# Patient Record
Sex: Male | Born: 1960 | Race: White | Hispanic: Yes | Marital: Married | Smoking: Former smoker
Health system: Southern US, Community
[De-identification: ages and names within clinical notes are randomized; demographics above are authoritative.]

## PROBLEM LIST (undated history)

## (undated) DIAGNOSIS — K648 Other hemorrhoids: Secondary | ICD-10-CM

## (undated) DIAGNOSIS — M25519 Pain in unspecified shoulder: Secondary | ICD-10-CM

## (undated) DIAGNOSIS — R229 Localized swelling, mass and lump, unspecified: Secondary | ICD-10-CM

## (undated) DIAGNOSIS — R55 Syncope and collapse: Secondary | ICD-10-CM

## (undated) DIAGNOSIS — Z86010 Personal history of colonic polyps: Secondary | ICD-10-CM

## (undated) DIAGNOSIS — J209 Acute bronchitis, unspecified: Secondary | ICD-10-CM

## (undated) DIAGNOSIS — L255 Unspecified contact dermatitis due to plants, except food: Secondary | ICD-10-CM

## (undated) HISTORY — DX: Unspecified contact dermatitis due to plants, except food: L25.5

## (undated) HISTORY — PX: COLONOSCOPY: SHX174

## (undated) HISTORY — DX: Other hemorrhoids: K64.8

## (undated) HISTORY — DX: Personal history of colonic polyps: Z86.010

## (undated) HISTORY — DX: Acute bronchitis, unspecified: J20.9

## (undated) HISTORY — DX: Pain in unspecified shoulder: M25.519

## (undated) HISTORY — DX: Syncope and collapse: R55

## (undated) HISTORY — PX: UMBILICAL HERNIA REPAIR: SHX196

## (undated) HISTORY — DX: Localized swelling, mass and lump, unspecified: R22.9

---
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
## 2005-10-08 ENCOUNTER — Encounter: Admission: RE | Admit: 2005-10-08 | Discharge: 2005-10-08 | Payer: Self-pay | Admitting: Occupational Medicine

## 2009-11-25 ENCOUNTER — Telehealth (INDEPENDENT_AMBULATORY_CARE_PROVIDER_SITE_OTHER): Payer: Self-pay | Admitting: *Deleted

## 2009-11-25 ENCOUNTER — Ambulatory Visit: Payer: Self-pay | Admitting: Internal Medicine

## 2009-12-14 ENCOUNTER — Ambulatory Visit: Payer: Self-pay | Admitting: Internal Medicine

## 2009-12-20 LAB — CONVERTED CEMR LAB
ALT: 39 units/L (ref 0–53)
AST: 27 units/L (ref 0–37)
Alkaline Phosphatase: 85 units/L (ref 39–117)
BUN: 12 mg/dL (ref 6–23)
Basophils Relative: 0.6 % (ref 0.0–3.0)
Bilirubin, Direct: 0.1 mg/dL (ref 0.0–0.3)
Calcium: 9.2 mg/dL (ref 8.4–10.5)
Chloride: 102 meq/L (ref 96–112)
Cholesterol: 216 mg/dL — ABNORMAL HIGH (ref 0–200)
Creatinine, Ser: 0.8 mg/dL (ref 0.4–1.5)
Direct LDL: 136.6 mg/dL
Eosinophils Relative: 2.6 % (ref 0.0–5.0)
GFR calc non Af Amer: 108.27 mL/min (ref >60)
Lymphocytes Relative: 33.2 % (ref 12.0–46.0)
Monocytes Absolute: 1.2 10*3/uL — ABNORMAL HIGH (ref 0.1–1.0)
Monocytes Relative: 9.8 % (ref 3.0–12.0)
Neutrophils Relative %: 53.8 % (ref 43.0–77.0)
Platelets: 236 10*3/uL (ref 150.0–400.0)
RBC: 5.32 M/uL (ref 4.22–5.81)
TSH: 1.01 microintl units/mL (ref 0.35–5.50)
Total Bilirubin: 1 mg/dL (ref 0.3–1.2)
Total CHOL/HDL Ratio: 6
Total Protein: 6.9 g/dL (ref 6.0–8.3)
Triglycerides: 253 mg/dL — ABNORMAL HIGH (ref 0.0–149.0)
VLDL: 50.6 mg/dL — ABNORMAL HIGH (ref 0.0–40.0)
WBC: 12.6 10*3/uL — ABNORMAL HIGH (ref 4.5–10.5)

## 2010-02-28 NOTE — Assessment & Plan Note (Signed)
Summary: new to est/cough/cbs   Vital Signs:  Patient profile:   50 year old male Height:      65.5 inches Weight:      189.25 pounds BMI:     31.13 O2 Sat:      97 % on Room air Temp:     98.1 degrees F oral Pulse rate:   71 / minute BP sitting:   118 / 76  (left arm) Cuff size:   regular  Vitals Entered By: Army Fossa CMA (November 25, 2009 9:25 AM)  O2 Flow:  Room air CC: Pt here c/o chest & head congestion. Comments x 4 days. Taking delysm not fasting [facility] [address] Declines flu shot today Discuss TD   History of Present Illness: New  patient One-week history of cough, chest congestion, fatigue. He took over-the-counter Delsym without much help Overall he  feels slightly better in the last few days   Preventive Screening-Counseling & Management  Alcohol-Tobacco     Smoking Status: never  Caffeine-Diet-Exercise     Does Patient Exercise: yes  Current Medications (verified): 1)  None  Allergies (verified): No Known Drug Allergies  Past History:  Past Medical History: no chronic problems   Past Surgical History: umbilical hernia repair   Family History: DM--no HTN-- no CAD--no colon ca--no prostate ca--no  Social History: original from  [place] married, 2 children  (16-13) tobacco-- no ETOH-- socially  occupation--  quite active Smoking Status:  never Does Patient Exercise:  yes  Review of Systems General:  no fever. ENT:  had sore throat and sinus congestion. Resp:  admits to green sputum. GI:  no nausea, vomiting, diarrhea.  Physical Exam  General:  alert, well-developed, and overweight-appearing.  no apparent distress Head:  face symmetric, nontender to palpation Ears:  R ear normal and L ear normal.   Nose:  not congested Mouth:  no redness or discharge Lungs:  normal respiratory effort, no intercostal retractions, no accessory muscle use, and normal breath sounds.   Heart:  normal rate, regular rhythm, no murmur, and no  gallop.   Extremities:  no lower extremity edema   Impression & Recommendations:  Problem # 1:  BRONCHITIS- ACUTE (ICD-466.0) symptoms consistent with bronchitis see  instructions Recommend a flu shot once he is better His updated medication list for this problem includes:    Zithromax Z-pak 250 Mg Tabs (Azithromycin) .Marland Kitchen... As directed  Problem # 2:  recommend a complete physical when better declined a  tetanus shot today  Complete Medication List: 1)  Zithromax Z-pak 250 Mg Tabs (Azithromycin) .... As directed  Patient Instructions: 1)  rest, fluids, tylenol if needed  2)  zothromax as prescribed 3)  mucinex twice a day x 1 week as needed for cough 4)  call if no better in few days 5)  consider a flu shotwhen better  6)  came for a physical at your convinience Prescriptions: ZITHROMAX Z-PAK 250 MG TABS (AZITHROMYCIN) as directed  #1 x 0   Entered and Authorized by:   Nolon Rod. Paz MD   Signed by:   Nolon Rod. Paz MD on 11/25/2009   Method used:   Print then Give to Patient   RxID:   [id number]    Orders Added: 1)  New Patient Level III [99203]     Risk Factors:  Tobacco use:  never Alcohol use:  no Exercise:  yes

## 2010-02-28 NOTE — Assessment & Plan Note (Signed)
Summary: CPX///SPH   Vital Signs:  Patient profile:   50 year old male Height:      65.5 inches (166.37 cm) Weight:      188.25 pounds (85.57 kg) BMI:     30.96 O2 Sat:      96 % on Room air Temp:     99.5 degrees F (37.50 degrees C) oral Pulse rate:   79 / minute BP sitting:   132 / 70  (left arm) Cuff size:   regular  Vitals Entered By: Lucious Groves CMA (December 14, 2009 10:03 AM)  O2 Flow:  Room air CC: Fasting CPX./kb Is Patient Diabetic? No Pain Assessment Patient in pain? no      Comments Patient declinded Tdap stating that it is done by his employer./kb   History of Present Illness: CPX  Current Medications (verified): 1)  None  Allergies (verified): No Known Drug Allergies  Past History:  Past Medical History: Reviewed history from 11/25/2009 and no changes required. no chronic problems   Past Surgical History: Reviewed history from 11/25/2009 and no changes required. umbilical hernia repair   Family History: Reviewed history from 11/25/2009 and no changes required. DM--no HTN-- no CAD--no colon ca--no prostate ca--no  Social History: original from  [place] married, 2 children  (16-13) tobacco-- no ETOH-- socially  occupation--  quite active  diet-- denies salt-fat heavy meals   Review of Systems General:  Denies fever and weight loss. CV:  Denies chest pain or discomfort, palpitations, and swelling of feet. Resp:  was seen w/ Bchitis, cough gone, still has some chest congestion on further questioning, states  he "always have" some chest congestion denies h/o asthma or wheezing  . GI:  Denies diarrhea, nausea, and vomiting; 1 time saw drops of blood in the toilete paper few weeks ago  no h/o hemorrhoids or pain in that area . GU:  Denies dysuria, hematuria, urinary frequency, and urinary hesitancy. Psych:  Denies anxiety and depression. Allergy:  occasionally sneezing .  Physical Exam  General:  alert, well-developed, and  overweight-appearing.   Neck:  no masses and no thyromegaly.   Lungs:  normal respiratory effort, no intercostal retractions, no accessory muscle use, and normal breath sounds.   Heart:  normal rate, regular rhythm, no murmur, and no gallop.   Abdomen:  soft, non-tender, no distention, no masses, no guarding, and no rigidity.   Rectal:  small external hemorrhoids noted. Normal sphincter tone. No rectal masses or tenderness. Brown stool, Hemoccult negative Prostate:  Prostate gland firm and smooth, no enlargement, nodularity, tenderness, mass, asymmetry or induration. Extremities:  no lower extremity edema Psych:  Cognition and judgment appear intact. Alert and cooperative with normal attention span and concentration. not anxious appearing and not depressed appearing.     Impression & Recommendations:  Problem # 1:  ROUTINE GENERAL MEDICAL EXAM@HEALTH  CARE FACL (ICD-V70.0) TD  ~ 2007 per patient declined flu shot   reports a Cscope and EGD 1998 ([place]) done for abdominal pain-- neg  saw few drops of blood in the toilet paper one time recently, on exam he has external hemorrhoids, stools are brown and Hemoccult negative today. Recommended patient to let me know if that happens again otherwise we're planning to recommend a colonoscopy at age 20.  diet  exercise discussed  Orders: Venipuncture (44010) TLB-Lipid Panel (80061-LIPID) TLB-TSH (Thyroid Stimulating Hormone) (84443-TSH) TLB-BMP (Basic Metabolic Panel-BMET) (80048-METABOL) TLB-CBC Platelet - w/Differential (85025-CBCD) TLB-Hepatic/Liver Function Pnl (80076-HEPATIC) TLB-PSA (Prostate Specific Antigen) (84153-PSA) Specimen  Handling (44010)  Problem # 2:  BRONCHITIS- ACUTE (ICD-466.0) improved, on looking back, the patient states that he always has some degree of chest congestion. Recommend claritin  as needed and Claritin daily, allergies? Also recommend to call me if no better, will need further testing  Patient  Instructions: 1)  take Claritin 10 mg over-the-counter one tablet daily  2)  Robitussin-DM as needed for chest congestion 3)  called if the chest congestion persist 4)  Please schedule a follow-up appointment in 1 year.    Orders Added: 1)  Venipuncture [36415] 2)  TLB-Lipid Panel [80061-LIPID] 3)  TLB-TSH (Thyroid Stimulating Hormone) [84443-TSH] 4)  TLB-BMP (Basic Metabolic Panel-BMET) [80048-METABOL] 5)  TLB-CBC Platelet - w/Differential [85025-CBCD] 6)  TLB-Hepatic/Liver Function Pnl [80076-HEPATIC] 7)  TLB-PSA (Prostate Specific Antigen) [27253-GUY] 8)  Specimen Handling [99000] 9)  Est. Patient age 27-64 [99396]   Immunization History:  Tetanus/Td Immunization History:    Tetanus/Td:  historical (11/29/2005)  Influenza Immunization History:    Influenza:  declined, explained benefits  (12/14/2009)  Not Administered:    Tetanus Vaccine not given due to: declined   Immunization History:  Tetanus/Td Immunization History:    Tetanus/Td:  Historical (11/29/2005)  Influenza Immunization History:    Influenza:  declined, explained benefits  (12/14/2009)

## 2010-02-28 NOTE — Progress Notes (Signed)
Summary: pt is waiting--needs clarification on directions and/ or amount  Phone Note From Pharmacy   Caller: Katy at [phone]  [facility] Summary of Call: needs clarification on amount for Z-pack----says dispense 1 as directed  is that 1 pill, one pack of six or ???---patient is waiting at [facility] Initial call taken by: Jerolyn Shin,  November 25, 2009 10:36 AM  Follow-up for Phone Call        instructed pharm it was the Z-pack pt needed. Follow-up by: Army Fossa CMA,  November 25, 2009 10:55 AM

## 2010-03-03 ENCOUNTER — Ambulatory Visit: Payer: Self-pay | Admitting: Internal Medicine

## 2010-03-08 ENCOUNTER — Ambulatory Visit (INDEPENDENT_AMBULATORY_CARE_PROVIDER_SITE_OTHER): Payer: BC Managed Care – PPO | Admitting: Internal Medicine

## 2010-03-08 ENCOUNTER — Other Ambulatory Visit: Payer: Self-pay | Admitting: Internal Medicine

## 2010-03-08 ENCOUNTER — Encounter: Payer: Self-pay | Admitting: Internal Medicine

## 2010-03-08 DIAGNOSIS — R229 Localized swelling, mass and lump, unspecified: Secondary | ICD-10-CM

## 2010-03-08 DIAGNOSIS — M25519 Pain in unspecified shoulder: Secondary | ICD-10-CM

## 2010-03-08 DIAGNOSIS — D72829 Elevated white blood cell count, unspecified: Secondary | ICD-10-CM

## 2010-03-08 LAB — CBC WITH DIFFERENTIAL/PLATELET
Basophils Absolute: 0.1 10*3/uL (ref 0.0–0.1)
Eosinophils Relative: 2.7 % (ref 0.0–5.0)
HCT: 49.4 % (ref 39.0–52.0)
Hemoglobin: 16.7 g/dL (ref 13.0–17.0)
Lymphs Abs: 4.8 10*3/uL — ABNORMAL HIGH (ref 0.7–4.0)
MCV: 88.4 fl (ref 78.0–100.0)
Monocytes Absolute: 0.9 10*3/uL (ref 0.1–1.0)
Monocytes Relative: 8.6 % (ref 3.0–12.0)
Neutro Abs: 4.9 10*3/uL (ref 1.4–7.7)
RDW: 13.6 % (ref 11.5–14.6)

## 2010-03-16 NOTE — Assessment & Plan Note (Signed)
Summary: FOLLOW UP//PH  Nurse Visit   Vital Signs:  Patient profile:   50 year old male Weight:      187.50 pounds Pulse rate:   76 / minute Pulse rhythm:   regular BP sitting:   122 / 84  (left arm) Cuff size:   regular  Vitals Entered By: Army Fossa CMA (March 08, 2010 10:35 AM)  History of Present Illness: followup   was found to have an elevated white count in November. Today, he reports that at the time he was having a dental infection  also complaining of left shoulder pain for 3 weeks, no injury, located at the a.c. joint, worse at night.  Also a long history of pain at the right tricipital area. He also feels a lump in that area.  ROS Denies repetitive motion of the left shoulder. 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   Msk:  outer aspect of the right tricipital area show some swelling, lump?Marland Kitchen The area is quite tender Extremities:  right shouldernormal Left shoulder slightly tender at the a.c. joint without deformities or swelling. Range of motion increased pain.   Impression & Recommendations:  Problem # 1:  LEUKOCYTOSIS (ICD-288.60) WBCs slightly elevated in November likely related to a dental infection that he had at the time. Labs  Orders: Venipuncture (34742) TLB-CBC Platelet - w/Differential (85025-CBCD)  Problem # 2:  all previous lab results discussed with the patient  Problem # 3:  SHOULDER PAIN (ICD-719.41)  shoulder pain, refer  to orthopedic surgery. Local injection? Motrin  Orders: Orthopedic Referral (Ortho)  Problem # 4:  LOCALIZED SUPERFICIAL SWELLING MASS OR LUMP (ICD-782.2)  he has a ill-defined tenderness and a question of a lump of the right tricipital  area. Refer to orthopedic surgery. MRI?  Orders: Orthopedic Referral (Ortho)   Patient Instructions: 1)  Take 200 mg of Ibuprofen (Advil, Motrin): 2 tablets every 6 hours  with food   as needed  for relief of pain . Watch for stomach irritation   CC: Follow up visit- fasting  Comments pain in (L) shoulder w/ movement  x 2-3 weeks  [facility] [address]    Current Medications (verified): 1)  None  Allergies (verified): No Known Drug Allergies  Orders Added: 1)  Venipuncture [59563] 2)  TLB-CBC Platelet - w/Differential [85025-CBCD] 3)  Est. Patient Level III [87564] 4)  Orthopedic Referral [Ortho]

## 2010-03-17 ENCOUNTER — Encounter: Payer: Self-pay | Admitting: Internal Medicine

## 2010-04-06 NOTE — Consult Note (Signed)
Summary: [facility]  [facility]   Imported By: Lanelle Bal 03/28/2010 13:50:29  _____________________________________________________________________  External Attachment:    Type:   Image     Comment:   External Document

## 2010-08-10 ENCOUNTER — Ambulatory Visit (INDEPENDENT_AMBULATORY_CARE_PROVIDER_SITE_OTHER): Payer: BC Managed Care – PPO | Admitting: Internal Medicine

## 2010-08-10 ENCOUNTER — Encounter: Payer: Self-pay | Admitting: Internal Medicine

## 2010-08-10 VITALS — BP 114/72 | HR 84 | Temp 98.7°F | Wt 187.4 lb

## 2010-08-10 DIAGNOSIS — J209 Acute bronchitis, unspecified: Secondary | ICD-10-CM

## 2010-08-10 DIAGNOSIS — L237 Allergic contact dermatitis due to plants, except food: Secondary | ICD-10-CM

## 2010-08-10 DIAGNOSIS — L255 Unspecified contact dermatitis due to plants, except food: Secondary | ICD-10-CM

## 2010-08-10 MED ORDER — PREDNISONE 20 MG PO TABS: 20.0000 mg | ORAL_TABLET | Freq: Two times a day (BID) | ORAL | Status: AC

## 2010-08-10 MED ORDER — HYDROCODONE-HOMATROPINE 5-1.5 MG/5ML PO SYRP: 5.0000 mL | ORAL_SOLUTION | Freq: Four times a day (QID) | ORAL | Status: AC | PRN

## 2010-08-10 MED ORDER — AZITHROMYCIN 250 MG PO TABS: ORAL_TABLET | ORAL | Status: AC

## 2010-08-10 NOTE — Patient Instructions (Signed)
Plain Mucinex for thick secretions ;force NON dairy fluids for next 48 hrs. Use a Neti pot daily as needed for sinus congestion .  Use Cortaid twice a day to the poison ivy lesions

## 2010-08-10 NOTE — Progress Notes (Signed)
  Subjective:    Patient ID: Rodney Burgess, male    DOB: 20-Sep-1960, 50 y.o.   MRN: [id number]  HPI Respiratory tract infection Onset/symptoms:7/2 as minor NP cough Exposures (illness/environmental/extrinsic):no Progression of symptoms:cough productive as of 7/6 Treatments/response:Delsym w/o significant  benefit Fever/chills:no  But sweating Frontal headache:no Facial pain:no Nasal purulence:no Sore throat:yes Dental pain:no Lymphadenopathy:yes Wheezing/shortness of breath:no Sputum: green Associated extrinsic/allergic symptoms:itchy eyes/ sneezing:no Past medical history: Seasonal allergies/asthma:no Smoking history:never           Review of Systems     Objective:   Physical Exam General appearance is of good health and nourishment; no acute distress or increased work of breathing is present.  No  lymphadenopathy about the head, neck, or axilla noted (despite history).   Eyes: No conjunctival inflammation or lid edema is present. There is no scleral icterus.  Ears:  External ear exam shows no significant lesions or deformities.  Otoscopic examination reveals clear canals, tympanic membranes are intact bilaterally without bulging, retraction, inflammation or discharge (despite ear pain).  Nose:  External nasal examination shows no deformity or inflammation. Nasal mucosa are pink and moist without lesions or exudates. No septal dislocation or dislocation.No obstruction to airflow.   Oral exam: Dental hygiene is good; lips and gums are healthy appearing.There is no  Significant oropharyngeal erythema or exudate noted.     Heart:  Normal rate and regular rhythm. S1 and S2 normal without gallop, murmur, click, rub or other extra sounds.   Lungs:Chest clear to auscultation; no wheezes, rhonchi,rales ,or rubs present.No increased work of breathing. Thick gray/ yellow sputum    Extremities:  No cyanosis, edema, or clubbing  noted    Skin: Warm & dry w/o jaundice or  tenting. He has diffuse, small   Poison ivy  lesions of the extremities, especially the legs           Assessment & Plan:  #1 bronchitis; no significant upper respiratory tract infection suggested. He describes cervical lymphadenopathy and ear pain but the exam is essentially negative.  #2 contact dermatitis from poison ivy  Plan: See orders

## 2011-04-18 ENCOUNTER — Ambulatory Visit (INDEPENDENT_AMBULATORY_CARE_PROVIDER_SITE_OTHER): Payer: BC Managed Care – PPO | Admitting: Internal Medicine

## 2011-04-18 VITALS — BP 128/80 | HR 75 | Temp 98.6°F | Wt 190.0 lb

## 2011-04-18 DIAGNOSIS — M25519 Pain in unspecified shoulder: Secondary | ICD-10-CM

## 2011-04-18 DIAGNOSIS — R229 Localized swelling, mass and lump, unspecified: Secondary | ICD-10-CM

## 2011-04-18 NOTE — Assessment & Plan Note (Addendum)
He does have a lump in the tricipital area, deep in the tissue. The lump is quite tender however is not fluctunat and pt is not running fevers. Plan: Referred to Gen. surgery for possible excision. I doubt the lump is related to shoulder pain

## 2011-04-18 NOTE — Assessment & Plan Note (Signed)
Bilateral ongoing shoulder pain, range of motion decreased, occasional radiation to the right arm. DDX: Rotator cuff injury, overuse syndrome, DJD Plan refer to orthopedic surgery

## 2011-04-18 NOTE — Progress Notes (Signed)
  Subjective:    Patient ID: Rodney Burgess, male    DOB: 05-24-1960, 51 y.o.   MRN: [id number]  HPI Acute visit The patient is here because he has a lump on the tricipital area, it has been there for a while, gradually increasing in size and started to hurt a few months ago, has gotten particularly tender in the last week. He also has chronic shoulder pain bilaterally, getting worse. Some radiation to the right arm.  Past Medical History: no chronic problems   Past Surgical History: umbilical hernia repair   Family History: DM--no HTN-- no CAD--no colon ca--no prostate ca--no  Social History: original from  [place] married, 2 children   tobacco-- no ETOH-- socially     Review of Systems Denies any neck pain. Occasional paresthesias at the right hand No fever or chills He does heavy physical work.     Objective:   Physical Exam  Constitutional: He appears well-developed. No distress.  Musculoskeletal: He exhibits no edema.       Neck is nontender to palpation. Shoulders are symmetric, range of motion is decreased throughout with the most affected motion being  arm elevation. Strength symmetric.  Skin: He is not diaphoretic.         Assessment & Plan:

## 2011-04-19 ENCOUNTER — Encounter: Payer: Self-pay | Admitting: Internal Medicine

## 2011-05-10 ENCOUNTER — Ambulatory Visit (INDEPENDENT_AMBULATORY_CARE_PROVIDER_SITE_OTHER): Payer: BC Managed Care – PPO | Admitting: Surgery

## 2011-05-21 ENCOUNTER — Ambulatory Visit (INDEPENDENT_AMBULATORY_CARE_PROVIDER_SITE_OTHER): Payer: BC Managed Care – PPO | Admitting: General Surgery

## 2011-05-21 ENCOUNTER — Encounter (INDEPENDENT_AMBULATORY_CARE_PROVIDER_SITE_OTHER): Payer: Self-pay | Admitting: General Surgery

## 2011-05-21 VITALS — BP 102/68 | HR 80 | Resp 20 | Ht 65.0 in | Wt 192.0 lb

## 2011-05-21 DIAGNOSIS — R229 Localized swelling, mass and lump, unspecified: Secondary | ICD-10-CM

## 2011-05-21 NOTE — Patient Instructions (Signed)
We will order an ultrasound of the area to get a better picture of the area

## 2011-05-21 NOTE — Progress Notes (Signed)
Patient ID: Rodney Burgess, male   DOB: Mar 22, 1960, 51 y.o.   MRN: [id number]  Chief Complaint  Patient presents with  . Mass    right arm    HPI Rodney Burgess is a 51 y.o. male.   HPI  51 year old male is referred by Dr. Drue Novel for evaluation of a right upper extremity subcutaneous mass. The patient states it has been there for at least one year. He states that it has slowly increased in size over the past year. However, it does change in size. Sometimes it will be larger and at other times it'll be smaller. He has not noticed any pattern to it. He denies any fevers, chills, weight loss, night sweats. He denies any family history of cancer or malignancy or sarcoma. He states that sometimes it will cause him discomfort. It will specifically cause him discomfort if he presses on it. It will cause the numbness in his distal anterior right forearm. He describes it as his forearm is asleep. He denies any trauma to the area. He denies any other masses.  No past medical history on file.  No past surgical history on file.  No family history on file.  Social History History  Substance Use Topics  . Smoking status: Never Smoker   . Smokeless tobacco: Not on file  . Alcohol Use: Yes    No Known Allergies  No current outpatient prescriptions on file.    Review of Systems Review of Systems  Constitutional: Negative for fever, chills, appetite change and unexpected weight change.  HENT: Negative for congestion and trouble swallowing.   Eyes: Negative for visual disturbance.  Respiratory: Negative for chest tightness and shortness of breath.   Cardiovascular: Negative for chest pain and leg swelling.       No PND, no orthopnea, no DOE  Gastrointestinal: Negative for abdominal pain, diarrhea and constipation.       See HPI  Genitourinary: Negative for dysuria and hematuria.  Musculoskeletal:       See hpi  Skin: Negative for rash.  Neurological: Negative for seizures and speech  difficulty.  Hematological: Does not bruise/bleed easily.  Psychiatric/Behavioral: Negative for behavioral problems and confusion.    Blood pressure 102/68, pulse 80, resp. rate 20, height 5\' 5"  (1.651 m), weight 192 lb (87.091 kg).  Physical Exam Physical Exam  Vitals reviewed. Constitutional: He is oriented to person, place, and time. He appears well-developed and well-nourished. No distress.  HENT:  Head: Normocephalic and atraumatic.  Right Ear: External ear normal.  Left Ear: External ear normal.  Eyes: Conjunctivae are normal. No scleral icterus.  Neck: Normal range of motion. Neck supple. No tracheal deviation present. No thyromegaly present.  Cardiovascular: Normal rate, regular rhythm and normal heart sounds.   Pulmonary/Chest: Effort normal and breath sounds normal. No respiratory distress. He has no wheezes.  Abdominal: Soft. Bowel sounds are normal. He exhibits no distension. There is no tenderness. There is no rebound.  Musculoskeletal: He exhibits tenderness (very mild TTP over lesion in rt tricep area). He exhibits no edema.       Strength is symmetric in b/l UE and shoulders  Lymphadenopathy:    He has no axillary adenopathy.       Right: No supraclavicular adenopathy present.       Left: No supraclavicular adenopathy present.  Neurological: He is alert and oriented to person, place, and time. No sensory deficit. He exhibits normal muscle tone.  Skin: Skin is warm and dry. No rash noted. He  is not diaphoretic. No erythema.          There is a vague nodularity over rt tricep area of about 2cm, ill defined. No skin changes. Feels fixed to muscle.   Psychiatric: He has a normal mood and affect. His behavior is normal. Thought content normal.    Data Reviewed Dr Leta Jungling note  Assessment    Swelling over right triceps muscle    Plan    I am not sure exactly what this is. I do feel a slight nodularity in the area. Differential diagnoses include a lymph node, cyst,  or a muscle irregularity. It is not consistent with a lipoma. Therefore I recommended to the patient getting an ultrasound of the area to hopefully get a better handle on exactly to this. We will base our followup on the results of the ultrasound.  Mary Sella. Andrey Campanile, MD, FACS General, Bariatric, & Minimally Invasive Surgery [facility], [place]        [facility] M 05/21/2011, 9:09 AM

## 2011-05-22 ENCOUNTER — Ambulatory Visit
Admission: RE | Admit: 2011-05-22 | Discharge: 2011-05-22 | Disposition: A | Discharge status: Home / Self Care | Payer: BC Managed Care – PPO | Source: Ambulatory Visit | Attending: General Surgery | Admitting: General Surgery

## 2011-05-22 DIAGNOSIS — R229 Localized swelling, mass and lump, unspecified: Secondary | ICD-10-CM

## 2011-05-23 ENCOUNTER — Telehealth (INDEPENDENT_AMBULATORY_CARE_PROVIDER_SITE_OTHER): Payer: Self-pay | Admitting: General Surgery

## 2011-05-23 NOTE — Telephone Encounter (Signed)
Called patient, no answer 

## 2011-05-23 NOTE — Telephone Encounter (Signed)
Left message for patient to call back  

## 2011-05-23 NOTE — Telephone Encounter (Signed)
Message copied by Liliana Cline on Wed May 23, 2011 10:11 AM ------      Message from: Andrey Campanile, ERIC M      Created: Wed May 23, 2011  9:21 AM       pls call pt with results. No mass identified. Just enlarged muscles. Nothing for me to remove surgically. If still has pain or discomfort, rec seeing a orthopedic surgeon.

## 2011-05-25 NOTE — Telephone Encounter (Signed)
Patient made aware of results and recommendations. To call if needed.

## 2011-07-19 ENCOUNTER — Ambulatory Visit: Payer: BC Managed Care – PPO | Admitting: Internal Medicine

## 2011-07-20 ENCOUNTER — Ambulatory Visit (INDEPENDENT_AMBULATORY_CARE_PROVIDER_SITE_OTHER): Payer: BC Managed Care – PPO | Admitting: Internal Medicine

## 2011-07-20 VITALS — BP 122/74 | HR 70 | Temp 98.0°F | Wt 187.0 lb

## 2011-07-20 DIAGNOSIS — R55 Syncope and collapse: Secondary | ICD-10-CM

## 2011-07-20 DIAGNOSIS — Z0000 Encounter for general adult medical examination without abnormal findings: Secondary | ICD-10-CM | POA: Insufficient documentation

## 2011-07-20 DIAGNOSIS — Z136 Encounter for screening for cardiovascular disorders: Secondary | ICD-10-CM

## 2011-07-20 DIAGNOSIS — R229 Localized swelling, mass and lump, unspecified: Secondary | ICD-10-CM

## 2011-07-20 LAB — CBC WITH DIFFERENTIAL/PLATELET
Basophils Absolute: 0.1 10*3/uL (ref 0.0–0.1)
Eosinophils Absolute: 0.4 10*3/uL (ref 0.0–0.7)
MCHC: 34.3 g/dL (ref 30.0–36.0)
MCV: 87.3 fl (ref 78.0–100.0)
Monocytes Absolute: 1 10*3/uL (ref 0.1–1.0)
Neutrophils Relative %: 44.9 % (ref 43.0–77.0)
Platelets: 251 10*3/uL (ref 150.0–400.0)
RDW: 13.2 % (ref 11.5–14.6)

## 2011-07-20 LAB — LIPID PANEL
Cholesterol: 191 mg/dL (ref 0–200)
HDL: 27.2 mg/dL — ABNORMAL LOW (ref >39.00)
VLDL: 170.6 mg/dL — ABNORMAL HIGH (ref 0.0–40.0)

## 2011-07-20 LAB — COMPREHENSIVE METABOLIC PANEL
Alkaline Phosphatase: 84 U/L (ref 39–117)
CO2: 26 mEq/L (ref 19–32)
Creatinine, Ser: 0.9 mg/dL (ref 0.4–1.5)
GFR: 95.23 mL/min (ref >60.00)
Glucose, Bld: 105 mg/dL — ABNORMAL HIGH (ref 70–99)
Total Bilirubin: 0.4 mg/dL (ref 0.3–1.2)

## 2011-07-20 LAB — LDL CHOLESTEROL, DIRECT: Direct LDL: 73.6 mg/dL

## 2011-07-20 NOTE — Assessment & Plan Note (Signed)
Ultrasound show no significant mass, general surgery recommended referral to orthopedic surgery if needed for pain.

## 2011-07-20 NOTE — Assessment & Plan Note (Addendum)
Single episode of syncope 4 weeks ago in the setting of laughing (vasovagal?). Cardiovascular examination is normal. Review of systems does no point to any specific etiology. Plan: EKG-- nsr  Echocardiogram General labs If everything is okay, I'll recommend observation. Addendum--- reports a similar syncope 6 months ago, will refer to cards for further w/u

## 2011-07-20 NOTE — Assessment & Plan Note (Signed)
TD ~ 2007 per patient reports a Cscope and EGD 1998 ([place]) done for abdominal pain-- neg  Labs diet  exercise discussed

## 2011-07-20 NOTE — Progress Notes (Signed)
Patient ID: [name], male   DOB: [birth year], 51 y.o.   MRN: [id number]

## 2011-07-20 NOTE — Progress Notes (Signed)
  Subjective:    Patient ID: Rodney Burgess, male    DOB: 11/09/1960, 51 y.o.   MRN: [id number]  HPI CPX In general feels well however about 4 weeks ago, he had a syncope. Patient reports he was at home, he was actually laughing, then he lost consciousness, can not describe  episodes any further. He was unconscious for 2 minutes, his wife reports no seizure activity, he reports no chest pain, palpitations, headaches. He was not anxious and was not drinking ETOH. Shortly after he went to a local clinic near his house and he was told he was okay. Reports no workup. No further episodes, he is now exercising more, no exertional CP  Past Medical History: no chronic problems   Past Surgical History: umbilical hernia repair    Family History: DM--no HTN-- no CAD--no colon ca--no prostate ca--no  Social History: original from  [place] married, 2 children    tobacco-- no ETOH-- socially  Diet-- described as healthy Exercise-- started a walking problem   Review of Systems No nausea, vomiting, diarrhea or blood in the stools. No dysuria gross hematuria. No chest pain or palpitations. No anxiety or depression.     Objective:   Physical Exam General -- alert, well-developed, and overweight appearing. No apparent distress.  Neck --no thyromegaly, normal carotid pulses Lungs -- normal respiratory effort, no intercostal retractions, no accessory muscle use, and normal breath sounds.   Heart-- normal rate, regular rhythm, no murmur, and no gallop.   Abdomen--soft, non-tender, no distention, no masses, no HSM, no guarding, and no rigidity.  No bruit Extremities-- no pretibial edema bilaterally  Neurologic-- alert & oriented X3 and strength normal in all extremities. Psych-- Cognition and judgment appear intact. Alert and cooperative with normal attention span and concentration.  not anxious appearing and not depressed appearing.       Assessment & Plan:

## 2011-07-22 ENCOUNTER — Encounter: Payer: Self-pay | Admitting: Internal Medicine

## 2011-07-25 ENCOUNTER — Telehealth: Payer: Self-pay | Admitting: Internal Medicine

## 2011-07-25 DIAGNOSIS — D72829 Elevated white blood cell count, unspecified: Secondary | ICD-10-CM

## 2011-07-25 NOTE — Telephone Encounter (Signed)
Spoke with the patient, triglycerides are elevated. He reports the day labs were taken he was not completely fasted. Does not like to try medication, would like to work few more weeks on his diet and exercise. His WBC has been chronically elevated. Plan: 1 return to the office fasting in 6 weeks only for blood work, FLP DX hyperlipidemia 2. Refer to hematology.

## 2011-07-26 ENCOUNTER — Ambulatory Visit (INDEPENDENT_AMBULATORY_CARE_PROVIDER_SITE_OTHER): Payer: BC Managed Care – PPO | Admitting: Cardiology

## 2011-07-26 ENCOUNTER — Encounter: Payer: Self-pay | Admitting: Cardiology

## 2011-07-26 VITALS — BP 122/72 | HR 77 | Ht 65.0 in | Wt 193.0 lb

## 2011-07-26 DIAGNOSIS — E781 Pure hyperglyceridemia: Secondary | ICD-10-CM

## 2011-07-26 DIAGNOSIS — E1169 Type 2 diabetes mellitus with other specified complication: Secondary | ICD-10-CM | POA: Insufficient documentation

## 2011-07-26 DIAGNOSIS — E663 Overweight: Secondary | ICD-10-CM

## 2011-07-26 DIAGNOSIS — D72829 Elevated white blood cell count, unspecified: Secondary | ICD-10-CM

## 2011-07-26 DIAGNOSIS — R55 Syncope and collapse: Secondary | ICD-10-CM

## 2011-07-26 NOTE — Assessment & Plan Note (Signed)
He was supposed to start on fenofibrate but I don't think he got this message. His triglycerides were greater than 800. I will refer this back to Willow Ora, MD

## 2011-07-26 NOTE — Assessment & Plan Note (Signed)
This has been noted and he is to be referred to hematology per Dr.Paz.

## 2011-07-26 NOTE — Assessment & Plan Note (Signed)
We began to discuss diet and exercise strategies.

## 2011-07-26 NOTE — Progress Notes (Signed)
   HPI The patient presents for evaluation of syncope. He has no prior cardiac history or workup.  He reports 2 episodes of in 2 months. The first was about one month ago. He was in bed with his wife. He was laughing. He suddenly lost consciousness without prodrome. He woke up and felt fine. He has not lost his urine or bowels. He did not have any palpitations or chest pain. The second episode was exactly the same in about one week ago. He otherwise denies any cardiovascular symptoms. He has no palpitations. He has no orthostatic symptoms. He is somewhat active at work but doesn't exercise. With this he does not get chest pressure, neck or arm discomfort. He does not get shortness of breath, PND or orthopnea. He has no weight gain or edema.   No Known Allergies  Current Outpatient Prescriptions  Medication Sig Dispense Refill  . ibuprofen (ADVIL,MOTRIN) 200 MG tablet Take 200 mg by mouth every 6 (six) hours as needed.        Past Medical History  Diagnosis Date  . Pain in joint, shoulder region   . Localized superficial swelling, mass, or lump   . Syncope and collapse   . Acute bronchitis   . 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 Umbilical hernia repair     No family history on file.  History   Social History  . Marital Status: Married    Spouse Name: N/A    Number of Children: N/A  . Years of Education: N/A   Occupational History  . Not on file.   Social History Main Topics  . Smoking status: Current Some Day Smoker  . Smokeless tobacco: Not on file   Comment: Patient states  that he only smoke once a month about  25 y  . Alcohol Use: Yes  . Drug Use: No  . Sexually Active: Not on file   Other Topics Concern  . Not on file   Social History Narrative   Lives at home with wife and two daughters.      ROS:  As stated in the HPI and negative for all other systems.  PHYSICAL EXAM BP 122/72   Pulse 77  Ht 5\' 5"  (1.651 m)  Wt 193 lb (87.544 kg)  BMI 32.12 kg/m2 GENERAL:  Well appearing HEENT:  Pupils equal round and reactive, fundi not visualized, oral mucosa unremarkable NECK:  No jugular venous distention, waveform within normal limits, carotid upstroke brisk and symmetric, no bruits, no thyromegaly LYMPHATICS:  No cervical, inguinal adenopathy LUNGS:  Clear to auscultation bilaterally BACK:  No CVA tenderness CHEST:  Unremarkable HEART:  PMI not displaced or sustained,S1 and S2 within normal limits, no S3, no S4, no clicks, no rubs, no murmurs ABD:  Flat, positive bowel sounds normal in frequency in pitch, no bruits, no rebound, no guarding, no midline pulsatile mass, no hepatomegaly, no splenomegaly EXT:  2 plus pulses throughout, no edema, no cyanosis no clubbing SKIN:  No rashes no nodules NEURO:  Cranial nerves II through XII grossly intact, motor grossly intact throughout PSYCH:  Cognitively intact, oriented to person place and time  EKG:  Sinus rhythm, rate 72, axis within normal limits, intervals within normal limits, no acute ST-T wave changes. 07/20/10  ASSESSMENT AND PLAN

## 2011-07-26 NOTE — Patient Instructions (Addendum)
The current medical regimen is effective;  continue present plan and medications.  Your physician has requested that you have an echocardiogram. Echocardiography is a painless test that uses sound waves to create images of your heart. It provides your doctor with information about the size and shape of your heart and how well your heart's chambers and valves are working. This procedure takes approximately one hour. There are no restrictions for this procedure.  Your physician has recommended that you wear an event monitor for 21 days. Event monitors are medical devices that record the heart's electrical activity. Doctors most often [place] these monitors to diagnose arrhythmias. Arrhythmias are problems with the speed or rhythm of the heartbeat. The monitor is a small, portable device. You can wear one while you do your normal daily activities. This is usually used to diagnose what is causing palpitations/syncope (passing out).  Follow up with Dr Antoine Poche after testing.

## 2011-07-26 NOTE — Assessment & Plan Note (Signed)
I would suggest that the etiology of this was vasovagal but it is quite unusual as he is lying flat when it happens. I did review his labs and he had normal TSH and electrolytes. I'm going to start with a 21 day event monitor and an echocardiogram.

## 2011-07-30 ENCOUNTER — Telehealth: Payer: Self-pay | Admitting: Hematology & Oncology

## 2011-07-30 NOTE — Telephone Encounter (Signed)
Pt aware of 7-24 appointment °

## 2011-08-08 ENCOUNTER — Encounter (INDEPENDENT_AMBULATORY_CARE_PROVIDER_SITE_OTHER): Payer: BC Managed Care – PPO

## 2011-08-08 ENCOUNTER — Ambulatory Visit (HOSPITAL_COMMUNITY): Payer: BC Managed Care – PPO | Attending: Cardiology | Admitting: Radiology

## 2011-08-08 DIAGNOSIS — E669 Obesity, unspecified: Secondary | ICD-10-CM | POA: Insufficient documentation

## 2011-08-08 DIAGNOSIS — R55 Syncope and collapse: Secondary | ICD-10-CM | POA: Insufficient documentation

## 2011-08-08 DIAGNOSIS — F17200 Nicotine dependence, unspecified, uncomplicated: Secondary | ICD-10-CM | POA: Insufficient documentation

## 2011-08-08 DIAGNOSIS — E785 Hyperlipidemia, unspecified: Secondary | ICD-10-CM | POA: Insufficient documentation

## 2011-08-08 NOTE — Progress Notes (Signed)
Echocardiogram performed.  

## 2011-08-22 ENCOUNTER — Ambulatory Visit (HOSPITAL_BASED_OUTPATIENT_CLINIC_OR_DEPARTMENT_OTHER): Payer: BC Managed Care – PPO | Admitting: Hematology & Oncology

## 2011-08-22 ENCOUNTER — Other Ambulatory Visit (HOSPITAL_BASED_OUTPATIENT_CLINIC_OR_DEPARTMENT_OTHER): Payer: BC Managed Care – PPO | Admitting: Lab

## 2011-08-22 ENCOUNTER — Ambulatory Visit: Payer: BC Managed Care – PPO

## 2011-08-22 VITALS — BP 103/67 | HR 73 | Temp 97.4°F | Ht 65.0 in | Wt 189.0 lb

## 2011-08-22 DIAGNOSIS — D72829 Elevated white blood cell count, unspecified: Secondary | ICD-10-CM

## 2011-08-22 LAB — CBC WITH DIFFERENTIAL (CANCER CENTER ONLY)
BASO%: 0.5 % (ref 0.0–2.0)
LYMPH#: 4.1 10*3/uL — ABNORMAL HIGH (ref 0.9–3.3)
LYMPH%: 38.1 % (ref 14.0–48.0)
MCV: 85 fL (ref 82–98)
MONO#: 1 10*3/uL — ABNORMAL HIGH (ref 0.1–0.9)
Platelets: 243 10*3/uL (ref 145–400)
RDW: 13.8 % (ref 11.1–15.7)
WBC: 10.7 10*3/uL — ABNORMAL HIGH (ref 4.0–10.0)

## 2011-08-22 LAB — CHCC SATELLITE - SMEAR

## 2011-08-22 NOTE — Progress Notes (Signed)
This office note has been dictated.

## 2011-08-23 NOTE — Progress Notes (Signed)
CC:   Rodney Ora, MD  DIAGNOSIS:  Minimal leukocytosis.  HISTORY OF PRESENT ILLNESS:  Rodney Burgess is a real nice 51 year old Hispanic gentleman.  [place]  He has been in he [place] for about 20 years.  he has been down in I think [place] area for about 3 years.  He sees Dr. Willow Burgess.  Dr. Drue Novel noted that he has had some elevation of his white cells.  Going back to November 2011, his white cell count 12.6.  He was not anemic or thrombocytopenic.  He says at that time he had a tooth infection.  In February 2012, his white cell count was down to 11.  The last WBC that I have on him from June shows a white cell count 10.9 with a hemoglobin of 16.4, hematocrit 47.7, and platelet count is 251.  He has a normal white cell differential.  Dr. Drue Novel felt that a hematologic evaluation was indicated to make sure there was no type of bone marrow abnormality.  Rodney Burgess has not had any issues with fevers.  He has had no rashes. He has had no abdominal pain.  There is no cough.  He is really not exposed to any type of occupational toxins.  He works as a Estate agent.  He does not drink.  He does not smoke.  He has had no weight loss or weight gain.  He has had no change in bowel or bladder habits.  There have been no mouth sores. Marland Kitchen  PAST MEDICAL HISTORY:  Unremarkable.  ALLERGIES:  None.  CURRENT MEDICATIONS:  Advil as needed.  SOCIAL HISTORY:  Negative for tobacco use.  He does have rare alcohol use.  Again, there are no occupational exposures.  FAMILY HISTORY:  Noncontributory. Marland Kitchen  REVIEW OF SYSTEMS:  As stated in the history of present illness.  No additional findings noted on 12-system review.  PHYSICAL EXAMINATION:  This is a well-developed, well-nourished Hispanic gentleman in no obvious distress.  Vital signs: 97.4, pulse 73, respiratory rate 22, blood pressure is 103/67, and weight is 189.  Head and neck:  Normocephalic, atraumatic skull.   There are no ocular or oral lesions.  There are no palpable cervical or supraclavicular lymph nodes. Lungs:  Clear to percussion and auscultation bilaterally.  Cardiac: Regular rate and rhythm with a normal S1 and S2.  There are no murmurs, rubs or bruits.  Abdomen:  Soft with good bowel sounds.  There is no palpable abdominal mass.  There is no fluid wave.  There is no palpable hepatosplenomegaly.  Back:  No tenderness over the spine, ribs, or hips. Extremities:  No clubbing, cyanosis or edema.  Skin:  No rashes, ecchymoses or petechia.  Neurological:  No focal neurological deficits.  LABORATORY STUDIES:  White cell count is 10.7, hemoglobin 15.9, hematocrit 44.6, platelet count 243.  Peripheral smear shows a normochromic, normocytic population of red blood cells.  There are no nucleated red blood cells.  There are no target cells.  I see no rouleaux formation.  He has no schistocytes. White cells appear normal in morphology and maturation.  I see no immature myeloid cells.  There are no hypersegmented polys.  He has no atypical lymphocytes.  There may be a couple of smudge cells.  Platelets are adequate in number and size.  IMPRESSION:  Rodney Burgess is a 51 year old Hispanic gentleman with minimal leukocytosis.  It seems as if his white cell count has been holding relatively  steady for a year and a half.  I do not see anything on his blood smear that would suggest a bone marrow issue.  It certainly could that the white cells are up as a reactionary measure.  I do not see any underlying malignancy that he would have that would cause leukocytosis.  I do not see any evidence of any myeloproliferative neoplasm that he could be harboring.  I just do not think that we are going to have a problem here.  I just feel that the leukocytosis is more reactive than anything else.  I suppose that this could be his "baseline" and that he may always have some degree of white cell elevation.  I  spent a good hour Rodney Burgess.  He is a real nice guy.  I do not feel that we have to see him back, however.  Everything from my point of view is normal.  I cannot find anything on his physical exam that would suggest an underlying hematologic issue.  Dr. Drue Novel, who has been very thorough, can certainly follow up with Rodney Burgess.  I would just check his CBC every 6 months.  His white cell count may fluctuate a little bit.  I would certainly have him come back to see [place] if we find that his white count continues on to trend upward.    ______________________________ Josph Macho, M.D. PRE/MEDQ  D:  08/22/2011  T:  08/23/2011  Job:  [id number]

## 2011-09-10 ENCOUNTER — Other Ambulatory Visit: Payer: BC Managed Care – PPO

## 2011-09-14 ENCOUNTER — Ambulatory Visit: Payer: BC Managed Care – PPO | Admitting: Cardiology

## 2011-11-17 ENCOUNTER — Telehealth: Payer: Self-pay | Admitting: Internal Medicine

## 2011-11-17 NOTE — Telephone Encounter (Signed)
Advise patient, he is overdue for labs Schedule :  FLP-- dx dyslipidemia

## 2011-11-19 NOTE — Telephone Encounter (Signed)
Lmovm for pt to call office. °

## 2012-01-12 NOTE — Telephone Encounter (Signed)
Send a letter, due for labs

## 2012-01-14 ENCOUNTER — Encounter: Payer: Self-pay | Admitting: Internal Medicine

## 2012-01-14 NOTE — Telephone Encounter (Signed)
Letter mailed

## 2013-05-13 ENCOUNTER — Encounter: Payer: Self-pay | Admitting: Internal Medicine

## 2013-06-19 ENCOUNTER — Ambulatory Visit (AMBULATORY_SURGERY_CENTER): Payer: Self-pay | Admitting: *Deleted

## 2013-06-19 VITALS — Ht 65.0 in | Wt 190.2 lb

## 2013-06-19 DIAGNOSIS — Z1211 Encounter for screening for malignant neoplasm of colon: Secondary | ICD-10-CM

## 2013-06-19 MED ORDER — NA SULFATE-K SULFATE-MG SULF 17.5-3.13-1.6 GM/177ML PO SOLN: ORAL | Status: DC

## 2013-06-19 NOTE — Progress Notes (Signed)
No egg or soy allergy  No home oxygen use or problems with anesthesia  No medications for weight loss taken  emmi information given 

## 2013-06-25 ENCOUNTER — Encounter: Payer: Self-pay | Admitting: Internal Medicine

## 2013-07-03 ENCOUNTER — Ambulatory Visit (AMBULATORY_SURGERY_CENTER): Payer: BC Managed Care – PPO | Admitting: Internal Medicine

## 2013-07-03 ENCOUNTER — Encounter: Payer: Self-pay | Admitting: Internal Medicine

## 2013-07-03 VITALS — BP 112/75 | HR 59 | Temp 97.4°F | Resp 22 | Ht 65.0 in | Wt 190.0 lb

## 2013-07-03 DIAGNOSIS — K5730 Diverticulosis of large intestine without perforation or abscess without bleeding: Secondary | ICD-10-CM

## 2013-07-03 DIAGNOSIS — Z1211 Encounter for screening for malignant neoplasm of colon: Secondary | ICD-10-CM

## 2013-07-03 DIAGNOSIS — K648 Other hemorrhoids: Secondary | ICD-10-CM

## 2013-07-03 DIAGNOSIS — D126 Benign neoplasm of colon, unspecified: Secondary | ICD-10-CM

## 2013-07-03 MED ORDER — SODIUM CHLORIDE 0.9 % IV SOLN: 500.0000 mL | INTRAVENOUS | Status: DC

## 2013-07-03 NOTE — Op Note (Signed)
[place]  [address] [place], [address]   COLONOSCOPY PROCEDURE REPORT  PATIENT: Rodney, Burgess  MR#: [id number] BIRTHDATE: 08-23-1960 , 53  yrs. old GENDER: Male ENDOSCOPIST: Gatha Mayer, MD, [facility] REFERRED AC:ZYSAY Cloward, M.D. PROCEDURE DATE:  07/03/2013 PROCEDURE:   Colonoscopy with snare polypectomy First Screening Colonoscopy - Avg.  risk and is 50 yrs.  old or older Yes.  Prior Negative Screening - Now for repeat screening. N/A  History of Adenoma - Now for follow-up colonoscopy & has been > or = to 3 yrs.  N/A  Polyps Removed Today? Yes. ASA CLASS:   Class II INDICATIONS:average risk screening and first colonoscopy. MEDICATIONS: Propofol (Diprivan) 290 mg IV, MAC sedation, administered by CRNA, and These medications were titrated to patient response per physician's verbal order  DESCRIPTION OF PROCEDURE:   After the risks benefits and alternatives of the procedure were thoroughly explained, informed consent was obtained.  A digital rectal exam revealed no abnormalities of the rectum, A digital rectal exam revealed the prostate was not enlarged, and A digital rectal exam revealed no prostatic nodules.   The LB [id number]  endoscope was introduced through the anus and advanced to the cecum, which was identified by both the appendix and ileocecal valve. No adverse events experienced.   The quality of the prep was excellent using Suprep  The instrument was then slowly withdrawn as the colon was fully examined.  COLON FINDINGS: A sessile polyp measuring 7 mm in size was found in the ascending colon.  A polypectomy was performed with a cold snare.  The resection was complete and the polyp tissue was completely retrieved.   Moderate diverticulosis was noted The finding was in the right colon.   Moderate diverticulosis was noted in the sigmoid colon.   The colon mucosa was otherwise normal. Retroflexed views revealed internal hemorrhoids. The  time to cecum=1 minutes 49 seconds.  Withdrawal time=9 minutes 02 seconds. The scope was withdrawn and the procedure completed. COMPLICATIONS: There were no complications.  ENDOSCOPIC IMPRESSION: 1.   Sessile polyp measuring 7 mm in size was found in the ascending colon; polypectomy was performed with a cold snare 2.   Moderate diverticulosis was noted in the right colon 3.   Moderate diverticulosis was noted in the sigmoid colon 4.   The colon mucosa was otherwise normal - excellent prep - first screening  RECOMMENDATIONS: Timing of repeat colonoscopy will be determined by pathology findings. If he has symptomatic hemorrhoids I can band them if desired  eSigned:  Gatha Mayer, MD, [facility] 07/03/2013 11:42 AM  cc: Gwenlyn Perking, MD and The Patient   PATIENT NAME:  Rodney, Burgess MR#: [id number]

## 2013-07-03 NOTE — Progress Notes (Signed)
Report to PACU, RN, vss, BBS= Clear.  

## 2013-07-03 NOTE — Progress Notes (Signed)
Called to room to assist during endoscopic procedure.  Patient ID and intended procedure confirmed with present staff. Received instructions for my participation in the procedure from the performing physician.  

## 2013-07-03 NOTE — Patient Instructions (Addendum)
I found and removed one polyp that looks benign (not cancer).  You also have a condition called diverticulosis - common and not usually a problem. Please read the handout provided.  Hemorrhoids also present - internal.  If you have hemorrhoid problems (swelling, itching, bleeding) I am able to treat those with an in-office procedure. If you like, please call my office at [phone] to schedule an appointment and I can evaluate you further.  I will let you know pathology results and when to have another routine colonoscopy by mail.  I appreciate the opportunity to care for you. Gatha Mayer, MD, [facility]  Discharge instructions given with verbal understanding. Handouts on polyps,diverticulosis and hemorrhoids. Resume previous medications. YOU HAD AN ENDOSCOPIC PROCEDURE TODAY AT [facility]: Refer to the procedure report that was given to you for any specific questions about what was found during the examination.  If the procedure report does not answer your questions, please call your gastroenterologist to clarify.  If you requested that your care partner not be given the details of your procedure findings, then the procedure report has been included in a sealed envelope for you to review at your convenience later.  YOU SHOULD EXPECT: Some feelings of bloating in the abdomen. Passage of more gas than usual.  Walking can help get rid of the air that was put into your GI tract during the procedure and reduce the bloating. If you had a lower endoscopy (such as a colonoscopy or flexible sigmoidoscopy) you may notice spotting of blood in your stool or on the toilet paper. If you underwent a bowel prep for your procedure, then you may not have a normal bowel movement for a few days.  DIET: Your first meal following the procedure should be a light meal and then it is ok to progress to your normal diet.  A half-sandwich or bowl of soup is an example of a good first meal.  Heavy or fried  foods are harder to digest and may make you feel nauseous or bloated.  Likewise meals heavy in dairy and vegetables can cause extra gas to form and this can also increase the bloating.  Drink plenty of fluids but you should avoid alcoholic beverages for 24 hours.  ACTIVITY: Your care partner should take you home directly after the procedure.  You should plan to take it easy, moving slowly for the rest of the day.  You can resume normal activity the day after the procedure however you should NOT DRIVE or use heavy machinery for 24 hours (because of the sedation medicines used during the test).    SYMPTOMS TO REPORT IMMEDIATELY: A gastroenterologist can be reached at any hour.  During normal business hours, 8:30 AM to 5:00 PM Monday through Friday, call [phone].  After hours and on weekends, please call the GI answering service at [phone] who will take a message and have the physician on call contact you.   Following lower endoscopy (colonoscopy or flexible sigmoidoscopy):  Excessive amounts of blood in the stool  Significant tenderness or worsening of abdominal pains  Swelling of the abdomen that is new, acute  Fever of 100F or higher  FOLLOW UP: If any biopsies were taken you will be contacted by phone or by letter within the next 1-3 weeks.  Call your gastroenterologist if you have not heard about the biopsies in 3 weeks.  Our staff will call the home number listed on your records the next business day following  your procedure to check on you and address any questions or concerns that you may have at that time regarding the information given to you following your procedure. This is a courtesy call and so if there is no answer at the home number and we have not heard from you through the emergency physician on call, we will assume that you have returned to your regular daily activities without incident.  SIGNATURES/CONFIDENTIALITY: You and/or your care partner have signed paperwork  which will be entered into your electronic medical record.  These signatures attest to the fact that that the information above on your After Visit Summary has been reviewed and is understood.  Full responsibility of the confidentiality of this discharge information lies with you and/or your care-partner.

## 2013-07-06 ENCOUNTER — Telehealth: Payer: Self-pay | Admitting: *Deleted

## 2013-07-06 ENCOUNTER — Telehealth: Payer: Self-pay

## 2013-07-06 NOTE — Telephone Encounter (Signed)
Message copied by Marlon Pel on Mon Jul 06, 2013  4:01 PM ------      Message from: Gatha Mayer      Created: Fri Jul 03, 2013 11:54 AM      Regarding: banding appt       Needs to be called for banding appt (can call next week)      Not urgent            Had colonoscopy today ------

## 2013-07-06 NOTE — Telephone Encounter (Signed)
I spoke with the patient's wife.  She will have him call back on Wed.

## 2013-07-06 NOTE — Telephone Encounter (Signed)
No answer, left message to call if questions or concerns. 

## 2013-07-09 ENCOUNTER — Encounter: Payer: Self-pay | Admitting: Internal Medicine

## 2013-07-09 DIAGNOSIS — Z860100 Personal history of colon polyps, unspecified: Secondary | ICD-10-CM

## 2013-07-09 HISTORY — DX: Personal history of colonic polyps: Z86.010

## 2013-07-09 HISTORY — DX: Personal history of colon polyps, unspecified: Z86.0100

## 2013-07-09 NOTE — Progress Notes (Signed)
Quick Note:  7 mm ascending hyperplastic (serrated) polyp - repeat colon 2020 ______

## 2013-07-13 NOTE — Telephone Encounter (Signed)
I have left another message for the patient to call back if she wants pursue a banding appt

## 2014-03-21 IMAGING — US US EXTREM UP *R* LTD
1 series · 14 of 17 positions shown · non-contrast
Comparison: None.

CLINICAL DATA: Soft tissue swelling right triceps region.

ULTRASOUND RIGHT UPPER EXTREMITY LIMITED
TECHNIQUE: Ultrasound examination of the right upper arm
posteriorly was performed

[Series 1: us extrem up *right* ltd · 0.36mm/px · 14 of 17 slices shown]
[im 1/17]
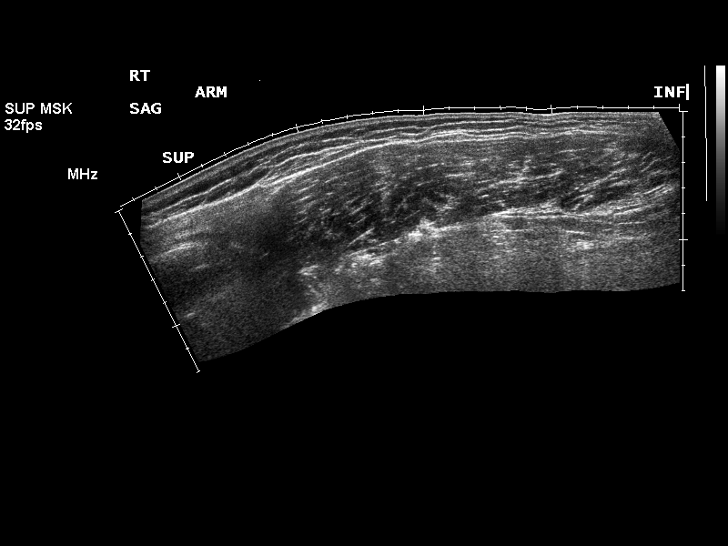
[im 2/17]
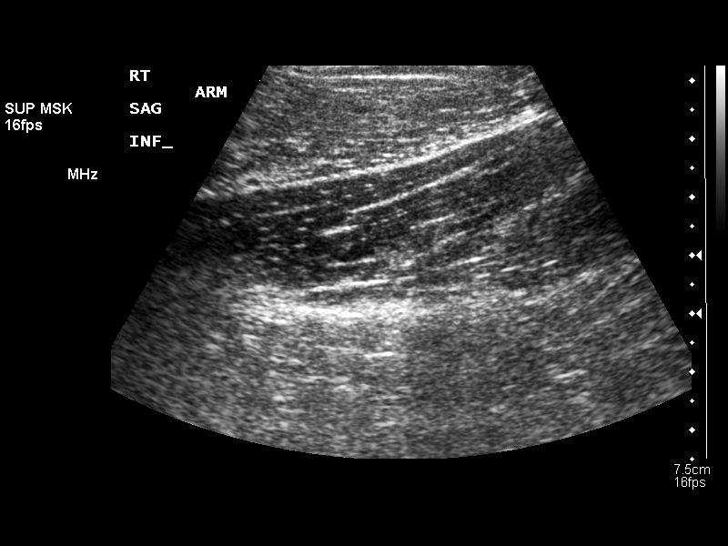
[im 4/17]
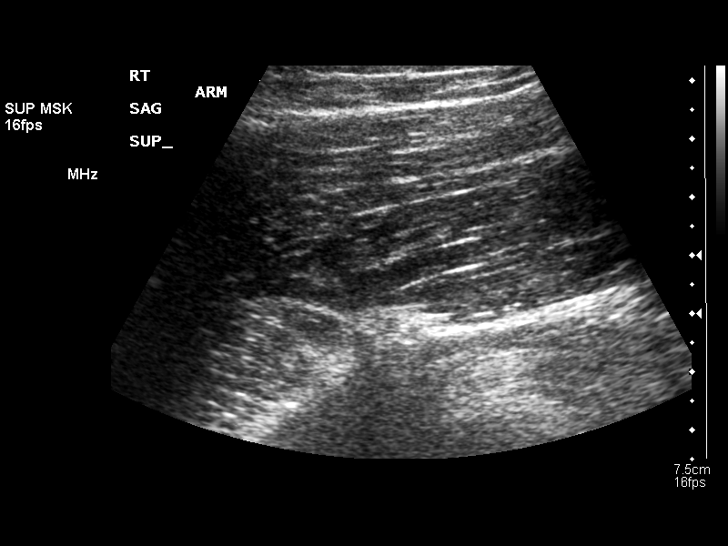
[im 5/17]
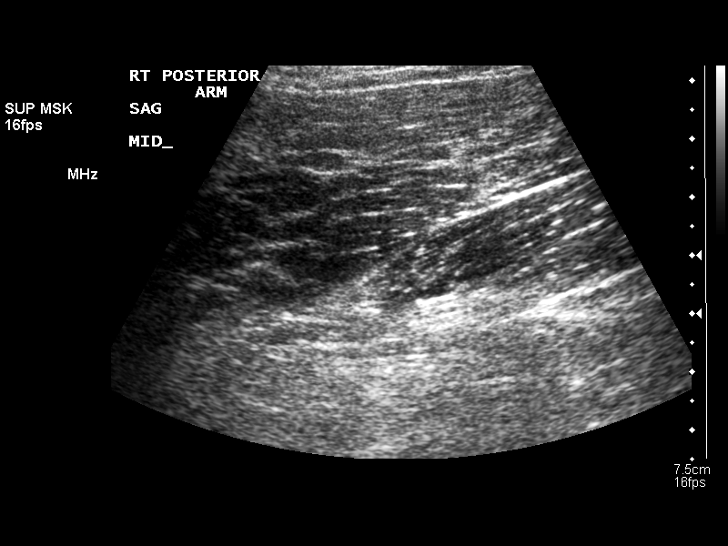
[im 6/17]
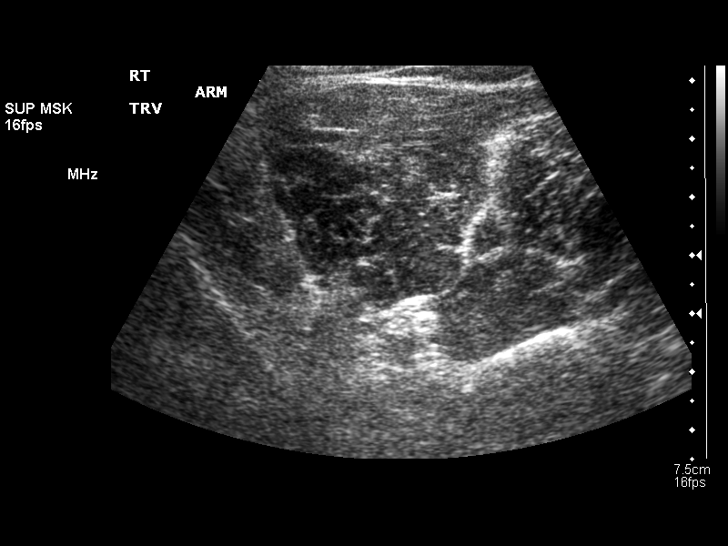
[im 7/17]
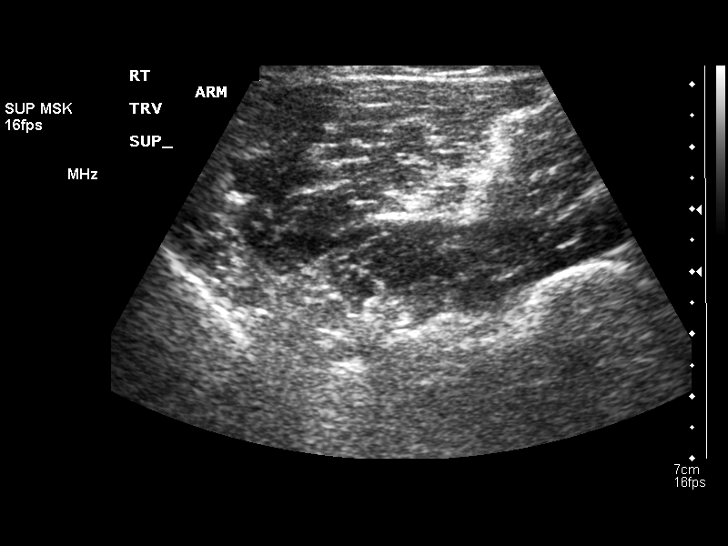
[im 8/17]
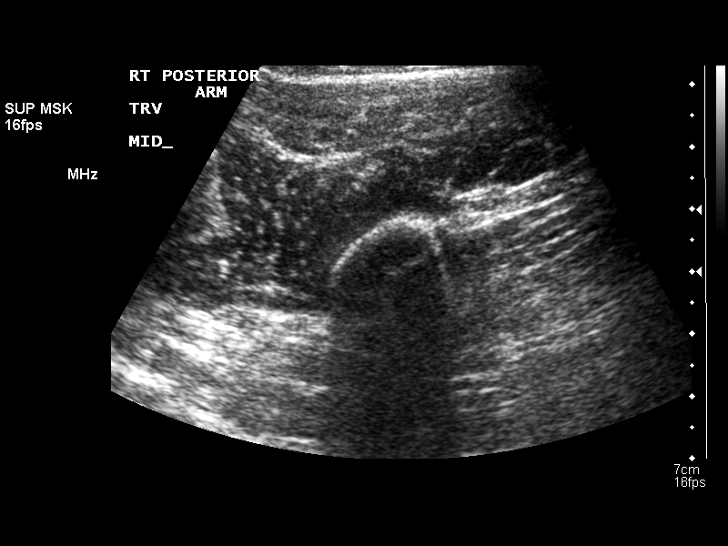
[im 10/17]
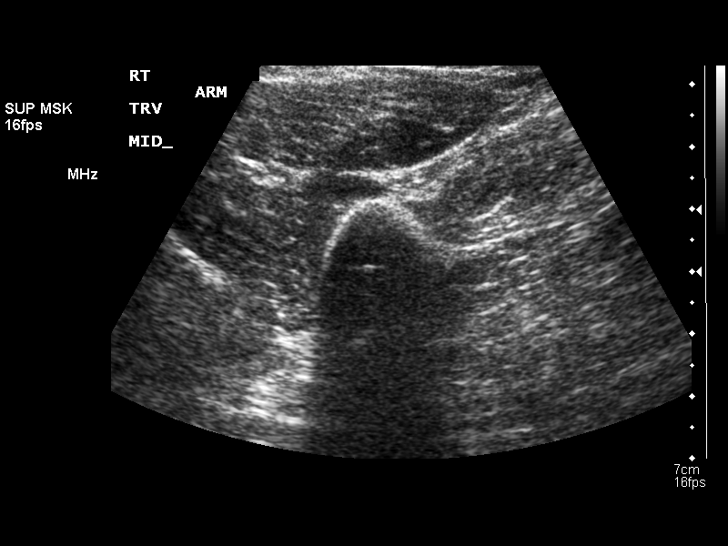
[im 11/17]
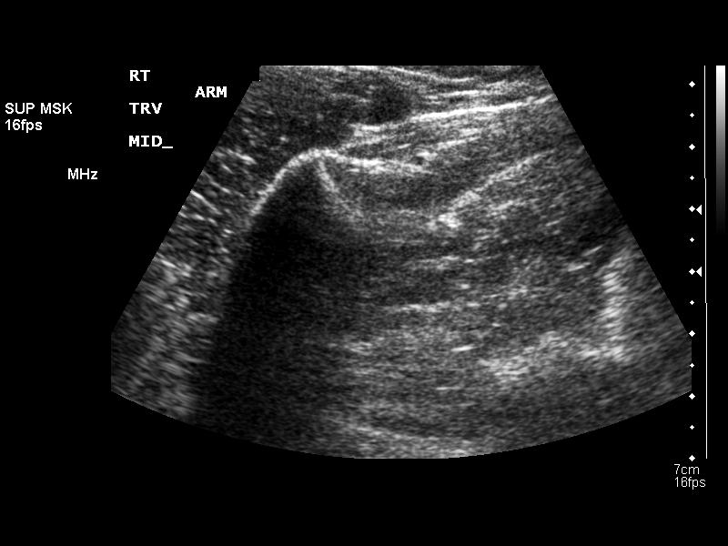
[im 12/17]
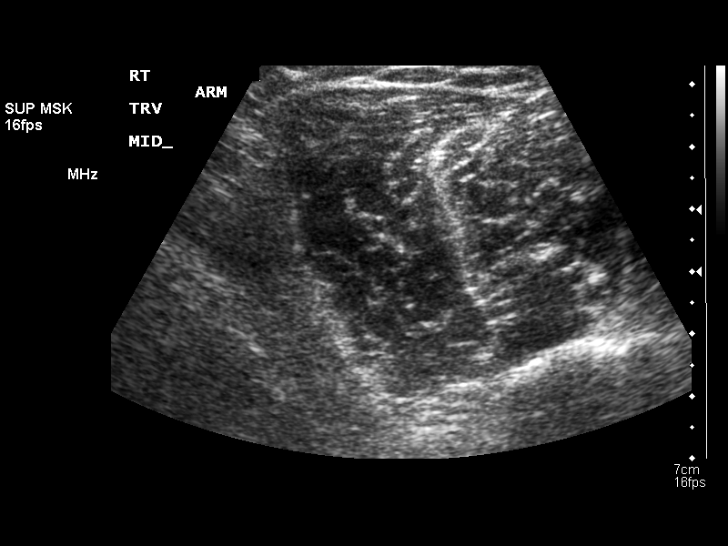
[im 13/17]
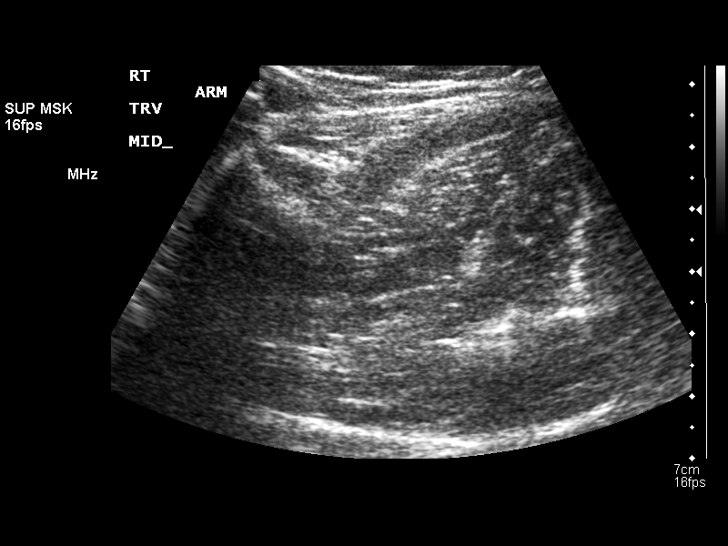
[im 14/17]
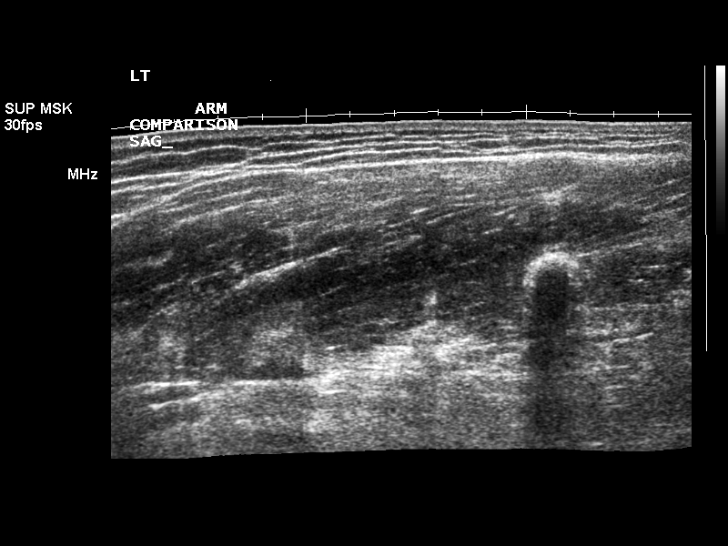
[im 16/17]
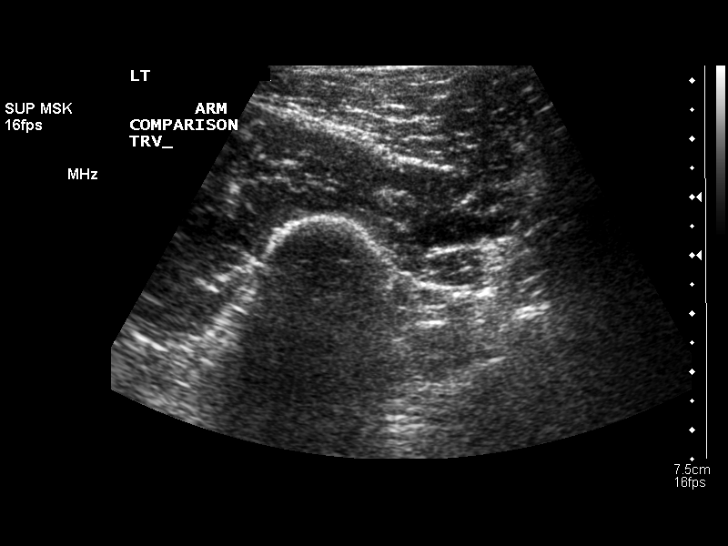
[im 17/17]
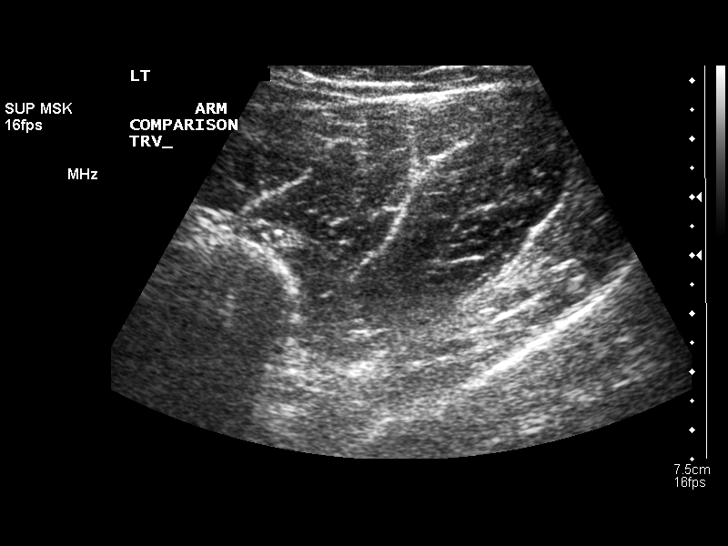

[14 of 17 positions shown; findings below may reference images not displayed]

FINDINGS: Asymmetric swelling of the soft tissues posterior to the
right humerus appears to  represent  muscular enlargement.  No
focal mass or cyst is identified.  The muscles are hypoechoic and
diffusely enlarged.  This may be due to edema or hypertrophy.
There is no skin thickening or evidence of cellulitis at this
point.
IMPRESSION: Enlargement of the triceps and deltoid muscles on the right.  No
focal mass or fluid collection.

## 2015-06-07 DIAGNOSIS — K625 Hemorrhage of anus and rectum: Secondary | ICD-10-CM | POA: Diagnosis not present

## 2015-06-07 DIAGNOSIS — Z6830 Body mass index (BMI) 30.0-30.9, adult: Secondary | ICD-10-CM | POA: Diagnosis not present

## 2015-06-07 DIAGNOSIS — Z0000 Encounter for general adult medical examination without abnormal findings: Secondary | ICD-10-CM | POA: Diagnosis not present

## 2015-06-07 DIAGNOSIS — K644 Residual hemorrhoidal skin tags: Secondary | ICD-10-CM | POA: Diagnosis not present

## 2015-09-28 DIAGNOSIS — H524 Presbyopia: Secondary | ICD-10-CM | POA: Diagnosis not present

## 2015-09-28 DIAGNOSIS — H5203 Hypermetropia, bilateral: Secondary | ICD-10-CM | POA: Diagnosis not present

## 2016-02-21 DIAGNOSIS — G2581 Restless legs syndrome: Secondary | ICD-10-CM | POA: Diagnosis not present

## 2016-02-21 DIAGNOSIS — G4733 Obstructive sleep apnea (adult) (pediatric): Secondary | ICD-10-CM | POA: Diagnosis not present

## 2016-02-28 ENCOUNTER — Ambulatory Visit (INDEPENDENT_AMBULATORY_CARE_PROVIDER_SITE_OTHER): Payer: BLUE CROSS/BLUE SHIELD | Admitting: Family Medicine

## 2016-02-28 VITALS — BP 106/77 | HR 75 | Temp 98.2°F | Wt 189.8 lb

## 2016-02-28 DIAGNOSIS — J111 Influenza due to unidentified influenza virus with other respiratory manifestations: Secondary | ICD-10-CM

## 2016-02-28 DIAGNOSIS — R69 Illness, unspecified: Secondary | ICD-10-CM | POA: Diagnosis not present

## 2016-02-28 MED ORDER — HYDROCODONE-HOMATROPINE 5-1.5 MG/5ML PO SYRP: 5.0000 mL | ORAL_SOLUTION | Freq: Every evening | ORAL | 0 refills | Status: DC | PRN

## 2016-02-28 MED ORDER — ALBUTEROL SULFATE HFA 108 (90 BASE) MCG/ACT IN AERS: 2.0000 | INHALATION_SPRAY | Freq: Four times a day (QID) | RESPIRATORY_TRACT | 2 refills | Status: DC | PRN

## 2016-02-28 MED ORDER — OSELTAMIVIR PHOSPHATE 75 MG PO CAPS: 75.0000 mg | ORAL_CAPSULE | Freq: Two times a day (BID) | ORAL | 0 refills | Status: DC

## 2016-02-28 NOTE — Progress Notes (Signed)
   Subjective:    Rodney Burgess is a 56 y.o. male who presents for evaluation of influenza like symptoms. Symptoms include chills, headache, myalgias, productive cough and fever and have been present for 1 day. He has tried to alleviate the symptoms with acetaminophen with minimal relief. High risk factors for influenza complications: none.  The pt has not had a flu shot. The patient does not havea history of asthma.   PMHx, SurgHx, SocialHx, Medications, and Allergies were reviewed in the Visit Navigator and updated as appropriate.   Review of Systems: Review of systems as in HPI. Otherwise negative.   Objective:   Vitals reviewed.  General appearance: alert, cooperative and appears stated age 74: oropharynx clear, no lesions and thyroid without masses Lungs: central lung congestion Heart: regular rate and rhythm, S1, S2 normal, no murmur, click, rub or gallop Abdomen: soft, non-tender; bowel sounds normal; no masses,  no organomegaly Extremities: extremities normal, atraumatic, no cyanosis or edema Pulses: 2+ and symmetric Skin: Skin color, texture, turgor normal. No rashes or lesions Neurologic: Grossly normal  Assessment and Plan:    Rodney Burgess was seen today for cough.  Diagnoses and all orders for this visit:  Influenza-like illness -     oseltamivir (TAMIFLU) 75 MG capsule; Take 1 capsule (75 mg total) by mouth 2 (two) times daily. -     HYDROcodone-homatropine (HYCODAN) 5-1.5 MG/5ML syrup; Take 5 mLs by mouth at bedtime as needed for cough. -     albuterol (PROVENTIL HFA;VENTOLIN HFA) 108 (90 Base) MCG/ACT inhaler; Inhale 2 puffs into the lungs every 6 (six) hours as needed for wheezing or shortness of breath.   . Reviewed expectations re: course of current medical issues. . Discussed self-management of symptoms. . Outlined signs and symptoms indicating need for more acute intervention. . Patient verbalized understanding and all questions were answered. . See  orders for this visit as documented in the electronic medical record. . Patient received an After Visit Summary.   Briscoe Deutscher, D.O.

## 2016-02-29 ENCOUNTER — Encounter: Payer: Self-pay | Admitting: Family Medicine

## 2016-03-09 ENCOUNTER — Telehealth: Payer: Self-pay | Admitting: Family Medicine

## 2016-03-09 NOTE — Telephone Encounter (Signed)
Paperwork: [employer]; Return to Work Production designer, theatre/television/film received by [employer] requesting form]: Patient   Individual made aware of 3-5 business day turn around (Y/N): Monday 03/12/16   Office form(s) completed and placed with paperwork (Y/N): n/a   Form location:  Dr. Juleen China folder at front office

## 2016-03-09 NOTE — Telephone Encounter (Signed)
Forms on Dr. Juleen China desk

## 2016-03-12 NOTE — Telephone Encounter (Signed)
Completed. In red folder.

## 2016-03-13 NOTE — Telephone Encounter (Signed)
Per Butch Penny forms has been taking care of.

## 2016-03-14 ENCOUNTER — Telehealth: Payer: Self-pay | Admitting: Family Medicine

## 2016-03-14 NOTE — Telephone Encounter (Signed)
Jocelyn Lamer, with [employer] calling to report they received paperwork for patient being taken out of work on 1/291/18.   However, she is unable to read the date in which the patient was supposed to return to work.  Please call back with clarification on return to work date.  She also states the form has Dr. Alcario Drought signature but does not have credentials or address included.  Please note in the future they will need to have this information included on the form.

## 2016-03-14 NOTE — Telephone Encounter (Signed)
Patients wife came in and took a physical copy of the paperwork. I made a copy for [place] and sent to scan.

## 2016-03-14 NOTE — Telephone Encounter (Signed)
Spoke with patient and he is going to bring a copy of paperwork back to the office so we can get the information off of the form that is needed.

## 2016-03-15 NOTE — Telephone Encounter (Signed)
Left message for Jocelyn Lamer that the return to work date is 03/05/16. I am going to also refax the form back to her.

## 2016-06-06 DIAGNOSIS — M9902 Segmental and somatic dysfunction of thoracic region: Secondary | ICD-10-CM | POA: Diagnosis not present

## 2016-06-06 DIAGNOSIS — M50122 Cervical disc disorder at C5-C6 level with radiculopathy: Secondary | ICD-10-CM | POA: Diagnosis not present

## 2016-06-06 DIAGNOSIS — M9901 Segmental and somatic dysfunction of cervical region: Secondary | ICD-10-CM | POA: Diagnosis not present

## 2016-06-06 DIAGNOSIS — M5383 Other specified dorsopathies, cervicothoracic region: Secondary | ICD-10-CM | POA: Diagnosis not present

## 2016-07-05 ENCOUNTER — Encounter: Payer: Self-pay | Admitting: Family Medicine

## 2016-07-05 ENCOUNTER — Ambulatory Visit (INDEPENDENT_AMBULATORY_CARE_PROVIDER_SITE_OTHER): Payer: BLUE CROSS/BLUE SHIELD | Admitting: Family Medicine

## 2016-07-05 VITALS — BP 114/68 | HR 69 | Temp 97.7°F | Ht 65.0 in | Wt 188.6 lb

## 2016-07-05 DIAGNOSIS — Z114 Encounter for screening for human immunodeficiency virus [HIV]: Secondary | ICD-10-CM | POA: Diagnosis not present

## 2016-07-05 DIAGNOSIS — Z0000 Encounter for general adult medical examination without abnormal findings: Secondary | ICD-10-CM

## 2016-07-05 DIAGNOSIS — Z13220 Encounter for screening for lipoid disorders: Secondary | ICD-10-CM

## 2016-07-05 DIAGNOSIS — E669 Obesity, unspecified: Secondary | ICD-10-CM

## 2016-07-05 DIAGNOSIS — Z1159 Encounter for screening for other viral diseases: Secondary | ICD-10-CM | POA: Diagnosis not present

## 2016-07-05 DIAGNOSIS — E781 Pure hyperglyceridemia: Secondary | ICD-10-CM

## 2016-07-05 NOTE — Progress Notes (Signed)
Subjective:  Water quality scientist, CMA, acting as scribe for Dr. Juleen China.  Rodney Burgess is a 56 y.o. male and is here for a comprehensive physical exam.  HPI: No concerns.  Hx of snoring and witnessed apneic episodes. Had sleep study through Orthodontist. Was told that he had sleep apnea. No intervention. He will bring in records.  Health Maintenance Due  Topic Date Due  . Hepatitis C Screening  1960-03-20  . HIV Screening  04/26/1977   PMHx, SurgHx, SocialHx, Medications, and Allergies were reviewed in the Visit Navigator and updated as appropriate.   Past Medical History:  Diagnosis Date  . Acute bronchitis   . Contact dermatitis and other eczema due to plants (except food)   . Localized superficial swelling, mass, or lump   . Pain in joint, shoulder region   . Personal history of colonic polyp - right sided hyperplastic (serrated) 07/09/2013  . Syncope and collapse    Past Surgical History:  Procedure Laterality Date  . COLONOSCOPY    . UMBILICAL HERNIA REPAIR     No family history on file. Social History  Substance Use Topics  . Smoking status: Former Research scientist (life sciences)  . Smokeless tobacco: Never Used     Comment: Patient states  that he only smoke once a month about  19 y  . Alcohol use Yes     Comment: occasional weekend   Review of Systems:   Review of Systems  Constitutional: Negative for chills and fever.  HENT: Negative for congestion, ear discharge and sore throat.   Respiratory: Negative for cough and shortness of breath.   Cardiovascular: Negative for chest pain and palpitations.  Gastrointestinal: Negative for abdominal pain, nausea and vomiting.  Genitourinary: Negative for frequency.  Musculoskeletal: Negative for back pain and neck pain.  Skin: Negative for rash.  Neurological: Negative for dizziness, loss of consciousness, weakness and headaches.  Endo/Heme/Allergies: Does not bruise/bleed easily.  Psychiatric/Behavioral: Negative for depression. The patient is  not nervous/anxious.    Objective:   Vitals:   07/05/16 0817  BP: 114/68  Pulse: 69  Temp: 97.7 F (36.5 C)   Body mass index is 31.38 kg/m.  General Appearance:  Alert, cooperative, no distress, appears stated age  Head:  Normocephalic, without obvious abnormality, atraumatic  Eyes:  PERRL, conjunctiva/corneas clear, EOM's intact, fundi benign, both eyes       Ears:  Normal TM's and external ear canals, both ears  Nose: Nares normal, septum midline, mucosa normal, no drainage    or sinus tenderness  Throat: Lips, mucosa, and tongue normal; teeth and gums normal  Neck: Supple, symmetrical, trachea midline, no adenopathy; thyroid:  No enlargement/tenderness/nodules; no carotit bruit or JVD  Back:   Symmetric, no curvature, ROM normal, no CVA tenderness  Lungs:   Clear to auscultation bilaterally, respirations unlabored  Chest wall:  No tenderness or deformity  Heart:  Regular rate and rhythm, S1 and S2 normal, no murmur, rub   or gallop  Abdomen:   Soft, non-tender, bowel sounds active all four quadrants, no masses, no organomegaly  Extremities: Extremities normal, atraumatic, no cyanosis or edema  Prostate: Not done.   Skin: Skin color, texture, turgor normal, no rashes or lesions  Lymph nodes: Cervical, supraclavicular, and axillary nodes normal  Neurologic: CNII-XII grossly intact. Normal strength, sensation and reflexes throughout   Assessment/Plan:   Rodney Burgess was seen today for annual exam.  Diagnoses and all orders for this visit:  Routine health maintenance -     Comprehensive  metabolic panel; Future  Screening for lipid disorders -     Lipid panel; Future  Encounter for screening for HIV -     HIV antibody; Future  Encounter for hepatitis C screening test for low risk patient -     Hepatitis C antibody; Future  Obesity (BMI 30-39.9) Comments: The patient is asked to make an attempt to improve diet and exercise patterns to aid in medical management of this  problem.   Patient Counseling: [x]   Nutrition: Stressed importance of moderation in sodium/caffeine intake, saturated fat and cholesterol, caloric balance, sufficient intake of fresh fruits, vegetables, and fiber.  [x]   Stressed the importance of regular exercise.   []   Substance Abuse: Discussed cessation/primary prevention of tobacco, alcohol, or other drug use; driving or other dangerous activities under the influence; availability of treatment for abuse.   [x]   Injury prevention: Discussed safety belts, safety helmets, smoke detector, smoking near bedding or upholstery.   []   Sexuality: Discussed sexually transmitted diseases, partner selection, use of condoms, avoidance of unintended pregnancy and contraceptive alternatives.   [x]   Dental health: Discussed importance of regular tooth brushing, flossing, and dental visits.  [x]   Health maintenance and immunizations reviewed. Please refer to Health maintenance section.    Briscoe Deutscher, DO [facility] served as Education administrator during this visit. History, Physical, and Plan performed by medical provider. The above documentation has been reviewed and is accurate and complete. Briscoe Deutscher, D.O.

## 2016-07-06 ENCOUNTER — Ambulatory Visit: Payer: BLUE CROSS/BLUE SHIELD | Admitting: Family Medicine

## 2016-07-09 ENCOUNTER — Other Ambulatory Visit (INDEPENDENT_AMBULATORY_CARE_PROVIDER_SITE_OTHER): Payer: BLUE CROSS/BLUE SHIELD

## 2016-07-09 DIAGNOSIS — Z1159 Encounter for screening for other viral diseases: Secondary | ICD-10-CM | POA: Diagnosis not present

## 2016-07-09 DIAGNOSIS — M50122 Cervical disc disorder at C5-C6 level with radiculopathy: Secondary | ICD-10-CM | POA: Diagnosis not present

## 2016-07-09 DIAGNOSIS — M5383 Other specified dorsopathies, cervicothoracic region: Secondary | ICD-10-CM | POA: Diagnosis not present

## 2016-07-09 DIAGNOSIS — Z114 Encounter for screening for human immunodeficiency virus [HIV]: Secondary | ICD-10-CM | POA: Diagnosis not present

## 2016-07-09 DIAGNOSIS — Z0000 Encounter for general adult medical examination without abnormal findings: Secondary | ICD-10-CM

## 2016-07-09 DIAGNOSIS — M9901 Segmental and somatic dysfunction of cervical region: Secondary | ICD-10-CM | POA: Diagnosis not present

## 2016-07-09 DIAGNOSIS — Z13220 Encounter for screening for lipoid disorders: Secondary | ICD-10-CM

## 2016-07-09 DIAGNOSIS — M9902 Segmental and somatic dysfunction of thoracic region: Secondary | ICD-10-CM | POA: Diagnosis not present

## 2016-07-09 LAB — COMPREHENSIVE METABOLIC PANEL
ALT: 26 U/L (ref 0–53)
AST: 17 U/L (ref 0–37)
Albumin: 4.2 g/dL (ref 3.5–5.2)
Alkaline Phosphatase: 74 U/L (ref 39–117)
BUN: 14 mg/dL (ref 6–23)
CO2: 29 mEq/L (ref 19–32)
Calcium: 9.4 mg/dL (ref 8.4–10.5)
Chloride: 104 mEq/L (ref 96–112)
Creatinine, Ser: 0.85 mg/dL (ref 0.40–1.50)
GFR: 99.76 mL/min (ref >60.00)
Glucose, Bld: 97 mg/dL (ref 70–99)
Potassium: 4.2 mEq/L (ref 3.5–5.1)
Sodium: 139 mEq/L (ref 135–145)
Total Bilirubin: 0.4 mg/dL (ref 0.2–1.2)
Total Protein: 7.2 g/dL (ref 6.0–8.3)

## 2016-07-09 LAB — LIPID PANEL
Cholesterol: 198 mg/dL (ref 0–200)
HDL: 33.1 mg/dL — ABNORMAL LOW (ref >39.00)
NonHDL: 165.28
Total CHOL/HDL Ratio: 6
Triglycerides: 272 mg/dL — ABNORMAL HIGH (ref 0.0–149.0)
VLDL: 54.4 mg/dL — ABNORMAL HIGH (ref 0.0–40.0)

## 2016-07-09 LAB — LDL CHOLESTEROL, DIRECT: Direct LDL: 120 mg/dL

## 2016-07-10 DIAGNOSIS — M5383 Other specified dorsopathies, cervicothoracic region: Secondary | ICD-10-CM | POA: Diagnosis not present

## 2016-07-10 DIAGNOSIS — M50122 Cervical disc disorder at C5-C6 level with radiculopathy: Secondary | ICD-10-CM | POA: Diagnosis not present

## 2016-07-10 DIAGNOSIS — M9901 Segmental and somatic dysfunction of cervical region: Secondary | ICD-10-CM | POA: Diagnosis not present

## 2016-07-10 DIAGNOSIS — M9902 Segmental and somatic dysfunction of thoracic region: Secondary | ICD-10-CM | POA: Diagnosis not present

## 2016-07-10 LAB — HEPATITIS C ANTIBODY: HCV Ab: NEGATIVE

## 2016-07-10 LAB — HIV ANTIBODY (ROUTINE TESTING W REFLEX): HIV 1&2 Ab, 4th Generation: NONREACTIVE

## 2016-07-12 ENCOUNTER — Telehealth: Payer: Self-pay | Admitting: Family Medicine

## 2016-07-12 NOTE — Telephone Encounter (Signed)
Patient returning phone call about lab results. Transferred the call to [place].

## 2016-07-12 NOTE — Telephone Encounter (Signed)
Spoke with pt informing him of his results. As well as his daughters results (verified on DPR). Understanding verbalized nothing further needed at this time.

## 2016-07-13 DIAGNOSIS — M9902 Segmental and somatic dysfunction of thoracic region: Secondary | ICD-10-CM | POA: Diagnosis not present

## 2016-07-13 DIAGNOSIS — M5383 Other specified dorsopathies, cervicothoracic region: Secondary | ICD-10-CM | POA: Diagnosis not present

## 2016-07-13 DIAGNOSIS — M9901 Segmental and somatic dysfunction of cervical region: Secondary | ICD-10-CM | POA: Diagnosis not present

## 2016-07-13 DIAGNOSIS — M50122 Cervical disc disorder at C5-C6 level with radiculopathy: Secondary | ICD-10-CM | POA: Diagnosis not present

## 2016-07-18 DIAGNOSIS — M5383 Other specified dorsopathies, cervicothoracic region: Secondary | ICD-10-CM | POA: Diagnosis not present

## 2016-07-18 DIAGNOSIS — M50122 Cervical disc disorder at C5-C6 level with radiculopathy: Secondary | ICD-10-CM | POA: Diagnosis not present

## 2016-07-18 DIAGNOSIS — M9901 Segmental and somatic dysfunction of cervical region: Secondary | ICD-10-CM | POA: Diagnosis not present

## 2016-07-18 DIAGNOSIS — M9902 Segmental and somatic dysfunction of thoracic region: Secondary | ICD-10-CM | POA: Diagnosis not present

## 2016-07-20 MED ORDER — SIMVASTATIN 20 MG PO TABS: 20.0000 mg | ORAL_TABLET | Freq: Every day | ORAL | 3 refills | Status: DC

## 2016-07-20 NOTE — Addendum Note (Signed)
Addended by: Briscoe Deutscher R on: 07/20/2016 01:37 PM   Modules accepted: Orders

## 2016-08-06 DIAGNOSIS — M9901 Segmental and somatic dysfunction of cervical region: Secondary | ICD-10-CM | POA: Diagnosis not present

## 2016-08-06 DIAGNOSIS — M9902 Segmental and somatic dysfunction of thoracic region: Secondary | ICD-10-CM | POA: Diagnosis not present

## 2016-08-06 DIAGNOSIS — M5383 Other specified dorsopathies, cervicothoracic region: Secondary | ICD-10-CM | POA: Diagnosis not present

## 2016-08-06 DIAGNOSIS — M50122 Cervical disc disorder at C5-C6 level with radiculopathy: Secondary | ICD-10-CM | POA: Diagnosis not present

## 2016-10-05 DIAGNOSIS — H52223 Regular astigmatism, bilateral: Secondary | ICD-10-CM | POA: Diagnosis not present

## 2016-10-05 DIAGNOSIS — H524 Presbyopia: Secondary | ICD-10-CM | POA: Diagnosis not present

## 2016-12-11 ENCOUNTER — Encounter: Payer: Self-pay | Admitting: Physician Assistant

## 2016-12-11 ENCOUNTER — Ambulatory Visit: Payer: BLUE CROSS/BLUE SHIELD | Admitting: Physician Assistant

## 2016-12-11 VITALS — BP 110/70 | HR 91 | Temp 98.1°F | Ht 65.0 in | Wt 192.5 lb

## 2016-12-11 DIAGNOSIS — J069 Acute upper respiratory infection, unspecified: Secondary | ICD-10-CM

## 2016-12-11 MED ORDER — FLUTICASONE PROPIONATE 50 MCG/ACT NA SUSP: 2.0000 | Freq: Every day | NASAL | 2 refills | Status: DC

## 2016-12-11 MED ORDER — HYDROCODONE-HOMATROPINE 5-1.5 MG/5ML PO SYRP: 5.0000 mL | ORAL_SOLUTION | Freq: Every evening | ORAL | 0 refills | Status: DC | PRN

## 2016-12-11 MED ORDER — AMOXICILLIN-POT CLAVULANATE 875-125 MG PO TABS: 1.0000 | ORAL_TABLET | Freq: Two times a day (BID) | ORAL | 0 refills | Status: DC

## 2016-12-11 NOTE — Patient Instructions (Signed)
It was great to meet you!  Start antibiotic, and take for at least 7 days. Use 2 sprays of flonase in each nostril in morning and night. Cough syrup at night as needed.  Follow-up if symptoms worsen or persist.

## 2016-12-11 NOTE — Progress Notes (Signed)
Rodney Burgess is a 56 y.o. male here for a new problem.  I acted as a Education administrator for [employer], PA-C Anselmo Pickler, LPN  History of Present Illness:   Chief Complaint  Patient presents with  . Right ear pain  . Sore Throat    Sore Throat   This is a new problem. Episode onset: Pt had cold 2 weeks, then 2 days ago started sore throat. The problem has been unchanged. Neither side of throat is experiencing more pain than the other. There has been no fever. The pain is at a severity of 10/10. The pain is severe. Associated symptoms include congestion, coughing, a hoarse voice, a plugged ear sensation, shortness of breath, swollen glands and trouble swallowing. Pertinent negatives include no drooling. He has tried NSAIDs (Advil and Motrin) for the symptoms. The treatment provided mild relief.  Cough  This is a new problem. Episode onset: started 2 weeks ago, came back 3 days ago. The problem has been gradually worsening. The problem occurs constantly. The cough is productive of sputum (Green sputum). Associated symptoms include chills, nasal congestion, postnasal drip and shortness of breath. Pertinent negatives include no wheezing. The symptoms are aggravated by lying down. Risk factors: Wife was sick 2 weeks ago. Treatments tried: Motrin and Advil. The treatment provided mild relief.   Endorses good appetite. Denies shortness of breath or chest pain. Denies stridor or difficulty breathing.   Past Medical History:  Diagnosis Date  . Acute bronchitis   . Contact dermatitis and other eczema due to plants (except food)   . Localized superficial swelling, mass, or lump   . Pain in joint, shoulder region   . Personal history of colonic polyp - right sided hyperplastic (serrated) 07/09/2013  . Syncope and collapse      Social History   Socioeconomic History  . Marital status: Married    Spouse name: Not on file  . Number of children: Not on file  . Years of education: Not on file  .  Highest education level: Not on file  Social Needs  . Financial resource strain: Not on file  . Food insecurity - worry: Not on file  . Food insecurity - inability: Not on file  . Transportation needs - medical: Not on file  . Transportation needs - non-medical: Not on file  Occupational History  . Not on file  Tobacco Use  . Smoking status: Former Research scientist (life sciences)  . Smokeless tobacco: Never Used  . Tobacco comment: Patient states  that he only smoke once a month about  89 y  Substance and Sexual Activity  . Alcohol use: Yes    Comment: occasional weekend  . Drug use: No  . Sexual activity: Yes  Other Topics Concern  . Not on file  Social History Narrative   Lives at home with wife and two daughters.      Past Surgical History:  Procedure Laterality Date  . COLONOSCOPY    . UMBILICAL HERNIA REPAIR      History reviewed. No pertinent family history.  No Known Allergies  Current Medications:   Current Outpatient Medications:  .  simvastatin (ZOCOR) 20 MG tablet, Take 1 tablet (20 mg total) by mouth at bedtime., Disp: 90 tablet, Rfl: 3 .  amoxicillin-clavulanate (AUGMENTIN) 875-125 MG tablet, Take 1 tablet 2 (two) times daily by mouth., Disp: 20 tablet, Rfl: 0 .  fluticasone (FLONASE) 50 MCG/ACT nasal spray, Place 2 sprays daily into both nostrils., Disp: 16 g, Rfl: 2 .  HYDROcodone-homatropine (HYCODAN) 5-1.5 MG/5ML syrup, Take 5 mLs at bedtime as needed by mouth for cough., Disp: 120 mL, Rfl: 0   Review of Systems:   Review of Systems  Constitutional: Positive for chills.  HENT: Positive for congestion, hoarse voice, postnasal drip and trouble swallowing. Negative for drooling.   Respiratory: Positive for cough and shortness of breath. Negative for wheezing.   Negative unless otherwise specified per history of present illness.  Vitals:   Vitals:   12/11/16 1037  BP: 110/70  Pulse: 91  Temp: 98.1 F (36.7 C)  TempSrc: Oral  SpO2: 95%  Weight: 192 lb 8 oz (87.3 kg)   Height: 5\' 5"  (1.651 m)     Body mass index is 32.03 kg/m.  Physical Exam:   Physical Exam  Constitutional: He appears well-developed. He is cooperative.  Non-toxic appearance. He does not have a sickly appearance. He does not appear ill. No distress.  HENT:  Head: Normocephalic and atraumatic.  Right Ear: External ear and ear canal normal. Tympanic membrane is not erythematous, not retracted and not bulging. A middle ear effusion is present.  Left Ear: Tympanic membrane, external ear and ear canal normal. Tympanic membrane is not erythematous, not retracted and not bulging.  Nose: Mucosal edema and rhinorrhea present. Right sinus exhibits maxillary sinus tenderness and frontal sinus tenderness. Left sinus exhibits no maxillary sinus tenderness and no frontal sinus tenderness.  Mouth/Throat: Uvula is midline and mucous membranes are normal. Posterior oropharyngeal edema and posterior oropharyngeal erythema present. Tonsils are 2+ on the right. Tonsils are 2+ on the left. Tonsillar exudate.  Eyes: Conjunctivae and lids are normal.  Neck: Trachea normal.  Cardiovascular: Normal rate, regular rhythm, S1 normal, S2 normal and normal heart sounds.  Pulmonary/Chest: Effort normal and breath sounds normal. He has no decreased breath sounds. He has no wheezes. He has no rhonchi. He has no rales.  Lymphadenopathy:    He has no cervical adenopathy.  Neurological: He is alert.  Skin: Skin is warm, dry and intact.  Psychiatric: He has a normal mood and affect. His speech is normal and behavior is normal.  Nursing note and vitals reviewed.   Assessment and Plan:    Ryle was seen today for right ear pain and sore throat.  Diagnoses and all orders for this visit:  Upper respiratory tract infection, unspecified type Patient with exudative tonsillitis as well as early sinusitis. I also suspect that he has a little bit of eustachian tube dysfunction in his right ear given his symptoms and  physical exam findings. Will treat with Augmentin per orders. No red flags on exam. Vitals stable. Recommended patient push fluids, and also start Flonase in bilateral nares. Follow-up if symptoms worsen or persist despite treatment. May alternate ibuprofen and Tylenol.  Other orders -     amoxicillin-clavulanate (AUGMENTIN) 875-125 MG tablet; Take 1 tablet 2 (two) times daily by mouth. -     HYDROcodone-homatropine (HYCODAN) 5-1.5 MG/5ML syrup; Take 5 mLs at bedtime as needed by mouth for cough. -     fluticasone (FLONASE) 50 MCG/ACT nasal spray; Place 2 sprays daily into both nostrils.    . Reviewed expectations re: course of current medical issues. . Discussed self-management of symptoms. . Outlined signs and symptoms indicating need for more acute intervention. . Patient verbalized understanding and all questions were answered. . See orders for this visit as documented in the electronic medical record. . Patient received an After-Visit Summary.  CMA or LPN served as Education administrator during  this visit. History, Physical, and Plan performed by medical provider. Documentation and orders reviewed and attested to.  Inda Coke, PA-C

## 2017-04-15 ENCOUNTER — Encounter: Payer: Self-pay | Admitting: Family Medicine

## 2017-04-15 ENCOUNTER — Ambulatory Visit (INDEPENDENT_AMBULATORY_CARE_PROVIDER_SITE_OTHER): Payer: BLUE CROSS/BLUE SHIELD | Admitting: Family Medicine

## 2017-04-15 VITALS — BP 118/68 | HR 97 | Temp 98.3°F | Wt 192.2 lb

## 2017-04-15 DIAGNOSIS — E785 Hyperlipidemia, unspecified: Secondary | ICD-10-CM

## 2017-04-15 DIAGNOSIS — R3911 Hesitancy of micturition: Secondary | ICD-10-CM | POA: Diagnosis not present

## 2017-04-15 DIAGNOSIS — Z0000 Encounter for general adult medical examination without abnormal findings: Secondary | ICD-10-CM

## 2017-04-15 DIAGNOSIS — Z86010 Personal history of colonic polyps: Secondary | ICD-10-CM

## 2017-04-15 LAB — LIPID PANEL
Cholesterol: 209 mg/dL — ABNORMAL HIGH (ref 0–200)
HDL: 34.2 mg/dL — ABNORMAL LOW (ref >39.00)
NonHDL: 174.77
Total CHOL/HDL Ratio: 6
Triglycerides: 269 mg/dL — ABNORMAL HIGH (ref 0.0–149.0)
VLDL: 53.8 mg/dL — ABNORMAL HIGH (ref 0.0–40.0)

## 2017-04-15 LAB — CBC WITH DIFFERENTIAL/PLATELET
Basophils Absolute: 0.1 10*3/uL (ref 0.0–0.1)
Basophils Relative: 0.8 % (ref 0.0–3.0)
Eosinophils Absolute: 0.2 10*3/uL (ref 0.0–0.7)
Eosinophils Relative: 2.2 % (ref 0.0–5.0)
HCT: 47.1 % (ref 39.0–52.0)
Hemoglobin: 16.4 g/dL (ref 13.0–17.0)
Lymphocytes Relative: 37 % (ref 12.0–46.0)
Lymphs Abs: 3.5 10*3/uL (ref 0.7–4.0)
MCHC: 34.8 g/dL (ref 30.0–36.0)
MCV: 88.6 fl (ref 78.0–100.0)
Monocytes Absolute: 0.8 10*3/uL (ref 0.1–1.0)
Monocytes Relative: 8.7 % (ref 3.0–12.0)
Neutro Abs: 4.8 10*3/uL (ref 1.4–7.7)
Neutrophils Relative %: 51.3 % (ref 43.0–77.0)
Platelets: 240 10*3/uL (ref 150.0–400.0)
RBC: 5.32 Mil/uL (ref 4.22–5.81)
RDW: 13.9 % (ref 11.5–15.5)
WBC: 9.4 10*3/uL (ref 4.0–10.5)

## 2017-04-15 LAB — COMPREHENSIVE METABOLIC PANEL
ALT: 26 U/L (ref 0–53)
AST: 17 U/L (ref 0–37)
Albumin: 4.1 g/dL (ref 3.5–5.2)
Alkaline Phosphatase: 73 U/L (ref 39–117)
BUN: 15 mg/dL (ref 6–23)
CO2: 29 mEq/L (ref 19–32)
Calcium: 9.8 mg/dL (ref 8.4–10.5)
Chloride: 102 mEq/L (ref 96–112)
Creatinine, Ser: 0.99 mg/dL (ref 0.40–1.50)
GFR: 83.43 mL/min (ref >60.00)
Glucose, Bld: 123 mg/dL — ABNORMAL HIGH (ref 70–99)
Potassium: 4.3 mEq/L (ref 3.5–5.1)
Sodium: 139 mEq/L (ref 135–145)
Total Bilirubin: 0.9 mg/dL (ref 0.2–1.2)
Total Protein: 7 g/dL (ref 6.0–8.3)

## 2017-04-15 LAB — URINALYSIS, ROUTINE W REFLEX MICROSCOPIC
Bilirubin Urine: NEGATIVE
Hgb urine dipstick: NEGATIVE
Ketones, ur: NEGATIVE
Leukocytes, UA: NEGATIVE
Nitrite: NEGATIVE
RBC / HPF: NONE SEEN (ref 0–?)
Specific Gravity, Urine: 1.025 (ref 1.000–1.030)
Total Protein, Urine: NEGATIVE
Urine Glucose: NEGATIVE
Urobilinogen, UA: 0.2 (ref 0.0–1.0)
pH: 6 (ref 5.0–8.0)

## 2017-04-15 LAB — PSA: PSA: 0.8 ng/mL (ref 0.10–4.00)

## 2017-04-15 LAB — LDL CHOLESTEROL, DIRECT: Direct LDL: 137 mg/dL

## 2017-04-15 NOTE — Progress Notes (Signed)
Subjective:    Rodney Burgess is a 57 y.o. male who presents today for his Complete Annual Exam.   Today, he complains of a 58-month history of intermittent urinary hesitancy.  He also complains of some posterior scrotal pain.  He cannot identify any triggers.  He has not done anything for treatment.  He denies any history of the same.  PMHx, SurgHx, SocialHx, Medications, and Allergies were reviewed in the Visit Navigator and updated as appropriate.   Past Medical History:  Diagnosis Date  . Acute bronchitis   . Contact dermatitis and other eczema due to plants (except food)   . Localized superficial swelling, mass, or lump   . Pain in joint, shoulder region   . Personal history of colonic polyp - right sided hyperplastic (serrated) 07/09/2013  . Syncope and collapse     Past Surgical History:  Procedure Laterality Date  . COLONOSCOPY    . UMBILICAL HERNIA REPAIR     History reviewed. No pertinent family history. Social History   Tobacco Use  . Smoking status: Former Research scientist (life sciences)  . Smokeless tobacco: Never Used  . Tobacco comment: Patient states  that he only smoke once a month about  44 y  Substance Use Topics  . Alcohol use: Yes    Comment: occasional weekend  . Drug use: No    Review of Systems:   Pertinent items are noted in the HPI. Otherwise, ROS is negative.  Objective:   Vitals:   04/15/17 0919  BP: 118/68  Pulse: 97  Temp: 98.3 F (36.8 C)  SpO2: 94%   Body mass index is 31.98 kg/m.  General Appearance:  Alert, cooperative, no distress, appears stated age  Head:  Normocephalic, without obvious abnormality, atraumatic  Eyes:  PERRL, conjunctiva/corneas clear, EOM's intact, fundi benign, both eyes       Ears:  Normal TM's and external ear canals, both ears  Nose: Nares normal, septum midline, mucosa normal, no drainage    or sinus tenderness  Throat: Lips, mucosa, and tongue normal; teeth and gums normal  Neck: Supple, symmetrical, trachea midline, no  adenopathy; thyroid:  No enlargement/tenderness/nodules; no carotit bruit or JVD  Back:   Symmetric, no curvature, ROM normal, no CVA tenderness  Lungs:   Clear to auscultation bilaterally, respirations unlabored  Chest wall:  No tenderness or deformity  Heart:  Regular rate and rhythm, S1 and S2 normal, no murmur, rub   or gallop  Abdomen:   Soft, non-tender, bowel sounds active all four quadrants, no masses, no organomegaly  Extremities: Extremities normal, atraumatic, no cyanosis or edema  Prostate: Not done. GU - WNL otherwise.   Skin: Skin color, texture, turgor normal, no rashes or lesions  Lymph nodes: Cervical, supraclavicular, and axillary nodes normal  Neurologic: CNII-XII grossly intact. Normal strength, sensation and reflexes throughout   Assessment/Plan:   Male was seen today for testicle pain.  Diagnoses and all orders for this visit:  Routine physical examination  Personal history of colonic polyp - right sided hyperplastic (serrated)  Hyperlipidemia, unspecified hyperlipidemia type -     CBC with Differential/Platelet -     Comprehensive metabolic panel -     Lipid panel  Urinary hesitancy -     Urinalysis, Routine w reflex microscopic -     PSA   Patient Counseling: [x]   Nutrition: Stressed importance of moderation in sodium/caffeine intake, saturated fat and cholesterol, caloric balance, sufficient intake of fresh fruits, vegetables, and fiber.  [x]   Stressed the  importance of regular exercise.   []   Substance Abuse: Discussed cessation/primary prevention of tobacco, alcohol, or other drug use; driving or other dangerous activities under the influence; availability of treatment for abuse.   [x]   Injury prevention: Discussed safety belts, safety helmets, smoke detector, smoking near bedding or upholstery.   []   Sexuality: Discussed sexually transmitted diseases, partner selection, use of condoms, avoidance of unintended pregnancy and contraceptive  alternatives.   [x]   Dental health: Discussed importance of regular tooth brushing, flossing, and dental visits.  [x]   Health maintenance and immunizations reviewed. Please refer to Health maintenance section.    Briscoe Deutscher, DO [place]

## 2017-04-22 ENCOUNTER — Other Ambulatory Visit: Payer: Self-pay

## 2017-04-22 DIAGNOSIS — E781 Pure hyperglyceridemia: Secondary | ICD-10-CM

## 2017-04-24 ENCOUNTER — Other Ambulatory Visit (INDEPENDENT_AMBULATORY_CARE_PROVIDER_SITE_OTHER): Payer: BLUE CROSS/BLUE SHIELD

## 2017-04-24 DIAGNOSIS — E781 Pure hyperglyceridemia: Secondary | ICD-10-CM | POA: Diagnosis not present

## 2017-04-24 LAB — HEMOGLOBIN A1C: Hgb A1c MFr Bld: 6.2 % (ref 4.6–6.5)

## 2017-10-09 DIAGNOSIS — H524 Presbyopia: Secondary | ICD-10-CM | POA: Diagnosis not present

## 2017-10-09 DIAGNOSIS — H2513 Age-related nuclear cataract, bilateral: Secondary | ICD-10-CM | POA: Diagnosis not present

## 2017-10-09 DIAGNOSIS — H43811 Vitreous degeneration, right eye: Secondary | ICD-10-CM | POA: Diagnosis not present

## 2017-10-09 DIAGNOSIS — H31012 Macula scars of posterior pole (postinflammatory) (post-traumatic), left eye: Secondary | ICD-10-CM | POA: Diagnosis not present

## 2017-10-17 ENCOUNTER — Ambulatory Visit: Payer: BLUE CROSS/BLUE SHIELD | Admitting: Family Medicine

## 2017-10-23 ENCOUNTER — Ambulatory Visit (INDEPENDENT_AMBULATORY_CARE_PROVIDER_SITE_OTHER): Payer: BLUE CROSS/BLUE SHIELD | Admitting: Family Medicine

## 2017-10-23 ENCOUNTER — Encounter: Payer: Self-pay | Admitting: Family Medicine

## 2017-10-23 VITALS — BP 110/68 | HR 72 | Temp 97.5°F | Ht 65.0 in | Wt 189.8 lb

## 2017-10-23 DIAGNOSIS — E8881 Metabolic syndrome: Secondary | ICD-10-CM

## 2017-10-23 DIAGNOSIS — E669 Obesity, unspecified: Secondary | ICD-10-CM | POA: Diagnosis not present

## 2017-10-23 DIAGNOSIS — G4733 Obstructive sleep apnea (adult) (pediatric): Secondary | ICD-10-CM

## 2017-10-23 DIAGNOSIS — E782 Mixed hyperlipidemia: Secondary | ICD-10-CM

## 2017-10-23 DIAGNOSIS — E88819 Insulin resistance, unspecified: Secondary | ICD-10-CM

## 2017-10-23 LAB — POCT GLYCOSYLATED HEMOGLOBIN (HGB A1C): Hemoglobin A1C: 5.7 % — AB (ref 4.0–5.6)

## 2017-10-23 NOTE — Progress Notes (Signed)
Rodney Burgess is a 57 y.o. male is here for follow up.  History of Present Illness:   Rodney Burgess, CMA acting as scribe for Dr. Briscoe Deutscher.   HPI: Patient in office for follow up on lab work done on 04/24/17. He has been working on decreasing his sugar intake. He has been walking twice a week.   Current symptoms: no polyuria or polydipsia, no chest pain, dyspnea or TIA's, no numbness, tingling or pain in extremities.  Maintaining a diabetic diet? [x]   YES  []   NO Trying to exercise on a regular basis? [x]   YES  []   NO  Lab Results  Component Value Date   HGBA1C 5.7 (A) 10/23/2017    No results found for: Rodney Burgess  Lab Results  Component Value Date   CHOL 209 (H) 04/15/2017   HDL 34.20 (L) 04/15/2017   LDLDIRECT 137.0 04/15/2017   TRIG 269.0 (H) 04/15/2017   CHOLHDL 6 04/15/2017     Wt Readings from Last 3 Encounters:  10/23/17 189 lb 12.8 oz (86.1 kg)  04/15/17 192 lb 3.2 oz (87.2 kg)  12/11/16 192 lb 8 oz (87.3 kg)   BP Readings from Last 3 Encounters:  10/23/17 110/68  04/15/17 118/68  12/11/16 110/70   Lab Results  Component Value Date   CREATININE 0.99 04/15/2017   There are no preventive care reminders to display for this patient.   Depression screen [facility] 2/9 10/23/2017 07/05/2016  Decreased Interest 0 0  Down, Depressed, Hopeless 0 0  PHQ - 2 Score 0 0  Altered sleeping 0 -  Tired, decreased energy 0 -  Change in appetite 0 -  Feeling bad or failure about yourself  0 -  Trouble concentrating 0 -  Moving slowly or fidgety/restless 0 -  Suicidal thoughts 0 -  PHQ-9 Score 0 -   PMHx, SurgHx, SocialHx, FamHx, Medications, and Allergies were reviewed in the Visit Navigator and updated as appropriate.   Patient Active Problem List   Diagnosis Date Noted  . Personal history of colonic polyp - right sided hyperplastic (serrated) 07/09/2013  . Hypertriglyceridemia 07/26/2011   Social History   Tobacco Use  . Smoking status: Former  Research scientist (life sciences)  . Smokeless tobacco: Never Used  . Tobacco comment: Patient states  that he only smoke once a month about  84 y  Substance Use Topics  . Alcohol use: Yes    Comment: occasional weekend  . Drug use: No   Current Medications and Allergies:  No current outpatient medications on file.  No Known Allergies Review of Systems   Pertinent items are noted in the HPI. Otherwise, ROS is negative.  Vitals:   Vitals:   10/23/17 1317  BP: 110/68  Pulse: 72  Temp: (!) 97.5 F (36.4 C)  TempSrc: Oral  SpO2: 96%  Weight: 189 lb 12.8 oz (86.1 kg)  Height: 5\' 5"  (1.651 m)     Body mass index is 31.58 kg/m.  Physical Exam:   Physical Exam  Constitutional: He is oriented to person, place, and time. He appears well-developed and well-nourished. No distress.  HENT:  Head: Normocephalic and atraumatic.  Right Ear: External ear normal.  Left Ear: External ear normal.  Nose: Nose normal.  Mouth/Throat: Oropharynx is clear and moist.  Eyes: Pupils are equal, round, and reactive to light. Conjunctivae and EOM are normal.  Neck: Normal range of motion. Neck supple.  Cardiovascular: Normal rate, regular rhythm, normal heart sounds and intact distal pulses.  Pulmonary/Chest:  Effort normal and breath sounds normal.  Abdominal: Soft. Bowel sounds are normal.  Musculoskeletal: Normal range of motion.  Neurological: He is alert and oriented to person, place, and time.  Skin: Skin is warm and dry.  Psychiatric: He has a normal mood and affect. His behavior is normal. Judgment and thought content normal.  Nursing note and vitals reviewed.   Results for orders placed or performed in visit on 10/23/17  POCT glycosylated hemoglobin (Hb A1C)  Result Value Ref Range   Hemoglobin A1C 5.7 (A) 4.0 - 5.6 %   HbA1c POC (<> result, manual entry)     HbA1c, POC (prediabetic range)     HbA1c, POC (controlled diabetic range)      Assessment and Plan:   [place] was seen today for  follow-up.  Diagnoses and all orders for this visit:  Mixed hyperlipidemia Comments: Will recheck at next visit. Patient does not want medications.  Obesity (BMI 30-39.9) Comments: Reviewed healthy food choices.   Insulin resistance Comments: Improved. Congratulated patient and ecouraged continued exercise.  Orders: -     POCT glycosylated hemoglobin (Hb A1C)  OSA (obstructive sleep apnea) Comments: Dx by [facility] [place]. No need for CPAP.   Marland Kitchen Reviewed expectations re: course of current medical issues. . Discussed self-management of symptoms. . Outlined signs and symptoms indicating need for more acute intervention. . Patient verbalized understanding and all questions were answered. Marland Kitchen Health Maintenance issues including appropriate healthy diet, exercise, and smoking avoidance were discussed with patient. . See orders for this visit as documented in the electronic medical record. . Patient received an After Visit Summary.  CMA served as Education administrator during this visit. History, Physical, and Plan performed by medical provider. The above documentation has been reviewed and is accurate and complete. Briscoe Deutscher, D.O.  Briscoe Deutscher, DO [place] 10/24/2017

## 2018-04-21 ENCOUNTER — Ambulatory Visit: Payer: BLUE CROSS/BLUE SHIELD | Admitting: Family Medicine

## 2018-04-28 ENCOUNTER — Encounter: Payer: Self-pay | Admitting: Family Medicine

## 2018-04-28 ENCOUNTER — Other Ambulatory Visit: Payer: Self-pay

## 2018-04-28 ENCOUNTER — Ambulatory Visit: Payer: BLUE CROSS/BLUE SHIELD | Admitting: Family Medicine

## 2018-04-28 ENCOUNTER — Ambulatory Visit (INDEPENDENT_AMBULATORY_CARE_PROVIDER_SITE_OTHER): Payer: BLUE CROSS/BLUE SHIELD | Admitting: Family Medicine

## 2018-04-28 VITALS — BP 124/68 | HR 83 | Temp 98.8°F | Ht 65.0 in | Wt 194.8 lb

## 2018-04-28 DIAGNOSIS — E781 Pure hyperglyceridemia: Secondary | ICD-10-CM

## 2018-04-28 DIAGNOSIS — Z0000 Encounter for general adult medical examination without abnormal findings: Secondary | ICD-10-CM | POA: Diagnosis not present

## 2018-04-28 DIAGNOSIS — E8881 Metabolic syndrome: Secondary | ICD-10-CM | POA: Diagnosis not present

## 2018-04-28 DIAGNOSIS — E88819 Insulin resistance, unspecified: Secondary | ICD-10-CM

## 2018-04-28 NOTE — Progress Notes (Signed)
Subjective:    Rodney Burgess is a 58 y.o. male who presents today for his Complete Annual Exam.  No current outpatient medications on file.  There are no preventive care reminders to display for this patient.  PMHx, SurgHx, SocialHx, Medications, and Allergies were reviewed in the Visit Navigator and updated as appropriate.   Past Medical History:  Diagnosis Date  . Acute bronchitis   . Contact dermatitis and other eczema due to plants (except food)   . Localized superficial swelling, mass, or lump   . Pain in joint, shoulder region   . Personal history of colonic polyp - right sided hyperplastic (serrated) 07/09/2013  . Syncope and collapse      Past Surgical History:  Procedure Laterality Date  . COLONOSCOPY    . UMBILICAL HERNIA REPAIR      History reviewed. No pertinent family history.  Social History   Tobacco Use  . Smoking status: Former Research scientist (life sciences)  . Smokeless tobacco: Never Used  . Tobacco comment: Patient states  that he only smoke once a month about  61 y  Substance Use Topics  . Alcohol use: Yes    Comment: occasional weekend  . Drug use: No    Review of Systems:   Pertinent items are noted in the HPI. Otherwise, ROS is negative.  Objective:    Vitals:   04/28/18 1532 04/28/18 1550  BP: 140/76 124/68  Pulse: 83   Temp: 98.8 F (37.1 C)   SpO2: 95%    Body mass index is 32.42 kg/m.  General  Alert, cooperative, no distress, appears stated age  Head:  Normocephalic, without obvious abnormality, atraumatic  Eyes:  PERRL, conjunctiva/corneas clear, EOM's intact, fundi benign, both eyes       Ears:  Normal TM's and external ear canals, both ears  Nose: Nares normal, septum midline, mucosa normal, no drainage or sinus tenderness  Throat: Lips, mucosa, and tongue normal; teeth and gums normal  Neck: Supple, symmetrical, trachea midline, no adenopathy; thyroid: no enlargement/tenderness/nodules; no carotid bruit or JVD  Back:   Symmetric, no  curvature, ROM normal, no CVA tenderness  Lungs:   Clear to auscultation bilaterally, respirations unlabored  Chest Wall:  No tenderness or deformity  Heart:  Regular rate and rhythm, S1 and S2 normal, no murmur, rub or gallop  Abdomen:   Soft, non-tender, bowel sounds active all four quadrants, no masses, no organomegaly  Extremities: Extremities normal, atraumatic, no cyanosis or edema  Prostate: Not done  Skin: Skin color, texture, turgor normal, no rashes or lesions  Lymph: Cervical, supraclavicular, and axillary nodes normal  Neurologic: CNII-XII grossly intact. Normal strength, sensation and reflexes throughout   AssessmentPlan:   Rodney Burgess was seen today for follow-up.  Diagnoses and all orders for this visit:  Routine physical examination  Hypertriglyceridemia -     Cancel: Comprehensive metabolic panel -     Cancel: Lipid panel -     Comprehensive metabolic panel; Future -     Lipid panel; Future  Insulin resistance -     Cancel: Hemoglobin A1c -     Hemoglobin A1c; Future    Patient Counseling: [x]   Nutrition: Stressed importance of moderation in sodium/caffeine intake, saturated fat and cholesterol, caloric balance, sufficient intake of fresh fruits, vegetables, and fiber  [x]   Stressed the importance of regular exercise.   []   Substance Abuse: Discussed cessation/primary prevention of tobacco, alcohol, or other drug use; driving or other dangerous activities under the influence; availability of treatment  for abuse.   [x]   Injury prevention: Discussed safety belts, safety helmets, smoke detector, smoking near bedding or upholstery.   []   Sexuality: Discussed sexually transmitted diseases, partner selection, use of condoms, avoidance of unintended pregnancy  and contraceptive alternatives.   [x]   Dental health: Discussed importance of regular tooth brushing, flossing, and dental visits.  [x]   Health maintenance and immunizations reviewed. Please refer to Health  maintenance section.   Briscoe Deutscher, DO [place]

## 2018-04-29 ENCOUNTER — Other Ambulatory Visit (INDEPENDENT_AMBULATORY_CARE_PROVIDER_SITE_OTHER): Payer: BLUE CROSS/BLUE SHIELD

## 2018-04-29 DIAGNOSIS — E8881 Metabolic syndrome: Secondary | ICD-10-CM

## 2018-04-29 DIAGNOSIS — E781 Pure hyperglyceridemia: Secondary | ICD-10-CM

## 2018-04-29 LAB — COMPREHENSIVE METABOLIC PANEL
ALT: 29 U/L (ref 0–53)
AST: 17 U/L (ref 0–37)
Albumin: 4 g/dL (ref 3.5–5.2)
Alkaline Phosphatase: 78 U/L (ref 39–117)
BUN: 9 mg/dL (ref 6–23)
CO2: 30 mEq/L (ref 19–32)
Calcium: 9.2 mg/dL (ref 8.4–10.5)
Chloride: 102 mEq/L (ref 96–112)
Creatinine, Ser: 0.9 mg/dL (ref 0.40–1.50)
GFR: 87.29 mL/min (ref >60.00)
Glucose, Bld: 95 mg/dL (ref 70–99)
Potassium: 4.3 mEq/L (ref 3.5–5.1)
Sodium: 138 mEq/L (ref 135–145)
Total Bilirubin: 0.4 mg/dL (ref 0.2–1.2)
Total Protein: 6.5 g/dL (ref 6.0–8.3)

## 2018-04-29 LAB — LIPID PANEL
Cholesterol: 195 mg/dL (ref 0–200)
HDL: 30.7 mg/dL — ABNORMAL LOW (ref >39.00)
NonHDL: 164.05
Total CHOL/HDL Ratio: 6
Triglycerides: 304 mg/dL — ABNORMAL HIGH (ref 0.0–149.0)
VLDL: 60.8 mg/dL — ABNORMAL HIGH (ref 0.0–40.0)

## 2018-04-29 LAB — LDL CHOLESTEROL, DIRECT: Direct LDL: 111 mg/dL

## 2018-04-29 LAB — HEMOGLOBIN A1C: Hgb A1c MFr Bld: 6.2 % (ref 4.6–6.5)

## 2018-06-20 ENCOUNTER — Encounter: Payer: Self-pay | Admitting: Internal Medicine

## 2018-08-20 ENCOUNTER — Telehealth: Payer: Self-pay | Admitting: *Deleted

## 2018-08-20 NOTE — Telephone Encounter (Signed)
Called patient for Previsit x 3 without answer

## 2018-08-20 NOTE — Telephone Encounter (Signed)
Called the patient again at 1124. No answer. Message left for the patient to call prior to 5 pm today. Patient informed if Previsit is not rescheduled by 5 today, bothe Previsit and Colonoscopy will be cancelled.

## 2018-08-22 ENCOUNTER — Other Ambulatory Visit: Payer: Self-pay

## 2018-08-22 ENCOUNTER — Ambulatory Visit (AMBULATORY_SURGERY_CENTER): Payer: Self-pay

## 2018-08-22 VITALS — Ht 65.0 in | Wt 182.0 lb

## 2018-08-22 DIAGNOSIS — Z86010 Personal history of colonic polyps: Secondary | ICD-10-CM

## 2018-08-22 NOTE — Progress Notes (Signed)
Denies allergies to eggs or soy products. Denies complication of anesthesia or sedation. Denies use of weight loss medication. Denies use of O2.   Emmi instructions given for colonoscopy.  Pre-Visit was conducted by phone due to Covid 19. Instructions were reviewed and mailed to patients confirmed home address. Patient was encouraged to call if he had questions regarding instructions.

## 2018-08-30 HISTORY — PX: HEMORRHOID BANDING: SHX5850

## 2018-09-02 ENCOUNTER — Telehealth: Payer: Self-pay | Admitting: Internal Medicine

## 2018-09-02 NOTE — Telephone Encounter (Signed)

## 2018-09-02 NOTE — Telephone Encounter (Signed)
Pt returned call and answered "NO" to all of the Covid-19 screening questions

## 2018-09-03 ENCOUNTER — Encounter: Payer: BLUE CROSS/BLUE SHIELD | Admitting: Internal Medicine

## 2018-09-03 ENCOUNTER — Ambulatory Visit (AMBULATORY_SURGERY_CENTER): Payer: BC Managed Care – PPO | Admitting: Internal Medicine

## 2018-09-03 ENCOUNTER — Other Ambulatory Visit: Payer: Self-pay

## 2018-09-03 ENCOUNTER — Encounter: Payer: Self-pay | Admitting: Internal Medicine

## 2018-09-03 VITALS — BP 107/68 | HR 68 | Temp 98.6°F | Resp 15 | Ht 65.0 in | Wt 194.0 lb

## 2018-09-03 DIAGNOSIS — D123 Benign neoplasm of transverse colon: Secondary | ICD-10-CM | POA: Diagnosis not present

## 2018-09-03 DIAGNOSIS — Z1211 Encounter for screening for malignant neoplasm of colon: Secondary | ICD-10-CM | POA: Diagnosis not present

## 2018-09-03 DIAGNOSIS — Z86010 Personal history of colonic polyps: Secondary | ICD-10-CM

## 2018-09-03 MED ORDER — SODIUM CHLORIDE 0.9 % IV SOLN: 500.0000 mL | Freq: Once | INTRAVENOUS | Status: DC

## 2018-09-03 NOTE — Progress Notes (Signed)
Called to room to assist during endoscopic procedure.  Patient ID and intended procedure confirmed with present staff. Received instructions for my participation in the procedure from the performing physician.  

## 2018-09-03 NOTE — Patient Instructions (Addendum)
I found and removed one tiny polyp that looks benign.  You also have a condition called diverticulosis - common and not usually a problem. Please read the handout provided.  I will let you know pathology results and when to have another routine colonoscopy by mail and/or My Chart.  The office will contact you to make appointment regarding hemorrhoidal banding.  I appreciate the opportunity to care for you.  Gatha Mayer, MD, FACG    YOU HAD AN ENDOSCOPIC PROCEDURE TODAY AT [facility]:   Refer to the procedure report that was given to you for any specific questions about what was found during the examination.  If the procedure report does not answer your questions, please call your gastroenterologist to clarify.  If you requested that your care partner not be given the details of your procedure findings, then the procedure report has been included in a sealed envelope for you to review at your convenience later.  YOU SHOULD EXPECT: Some feelings of bloating in the abdomen. Passage of more gas than usual.  Walking can help get rid of the air that was put into your GI tract during the procedure and reduce the bloating. If you had a lower endoscopy (such as a colonoscopy or flexible sigmoidoscopy) you may notice spotting of blood in your stool or on the toilet paper. If you underwent a bowel prep for your procedure, you may not have a normal bowel movement for a few days.  Please Note:  You might notice some irritation and congestion in your nose or some drainage.  This is from the oxygen used during your procedure.  There is no need for concern and it should clear up in a day or so.  SYMPTOMS TO REPORT IMMEDIATELY:   Following lower endoscopy (colonoscopy or flexible sigmoidoscopy):  Excessive amounts of blood in the stool  Significant tenderness or worsening of abdominal pains  Swelling of the abdomen that is new, acute  Fever of 100F or higher    For  urgent or emergent issues, a gastroenterologist can be reached at any hour by calling [phone].   DIET:  We do recommend a small meal at first, but then you may proceed to your regular diet.  Drink plenty of fluids but you should avoid alcoholic beverages for 24 hours.  ACTIVITY:  You should plan to take it easy for the rest of today and you should NOT DRIVE or use heavy machinery until tomorrow (because of the sedation medicines used during the test).    FOLLOW UP: Our staff will call the number listed on your records 48-72 hours following your procedure to check on you and address any questions or concerns that you may have regarding the information given to you following your procedure. If we do not reach you, we will leave a message.  We will attempt to reach you two times.  During this call, we will ask if you have developed any symptoms of COVID 19. If you develop any symptoms (ie: fever, flu-like symptoms, shortness of breath, cough etc.) before then, please call [phone].  If you test positive for Covid 19 in the 2 weeks post procedure, please call and report this information to [place].    If any biopsies were taken you will be contacted by phone or by letter within the next 1-3 weeks.  Please call us at [phone] if you have not heard about the biopsies in 3 weeks.    SIGNATURES/CONFIDENTIALITY: You  and/or your care partner have signed paperwork which will be entered into your electronic medical record.  These signatures attest to the fact that that the information above on your After Visit Summary has been reviewed and is understood.  Full responsibility of the confidentiality of this discharge information lies with you and/or your care-partner.

## 2018-09-03 NOTE — Op Note (Addendum)
[place] [facility] Patient Name: Rodney Burgess Procedure Date: 09/03/2018 7:25 AM MRN: [id number] Endoscopist: Gatha Mayer , MD Age: 58 Referring MD:  Date of Birth: 1960-07-05 Gender: Male Account #: [id number] Procedure:                Colonoscopy Indications:              High risk colon cancer surveillance: Personal                            history of sessile serrated colon polyp (less than                            10 mm in size) with no dysplasia Medicines:                Propofol per Anesthesia, Monitored Anesthesia Care Procedure:                Pre-Anesthesia Assessment:                           - Prior to the procedure, a History and Physical                            was performed, and patient medications and                            allergies were reviewed. The patient's tolerance of                            previous anesthesia was also reviewed. The risks                            and benefits of the procedure and the sedation                            options and risks were discussed with the patient.                            All questions were answered, and informed consent                            was obtained. Prior Anticoagulants: The patient has                            taken no previous anticoagulant or antiplatelet                            agents. ASA Grade Assessment: II - A patient with                            mild systemic disease. After reviewing the risks                            and benefits, the patient was deemed in  satisfactory condition to undergo the procedure.                           After obtaining informed consent, the colonoscope                            was passed under direct vision. Throughout the                            procedure, the patient's blood pressure, pulse, and                            oxygen saturations were monitored continuously. The   Colonoscope was introduced through the anus and                            advanced to the the cecum, identified by                            appendiceal orifice and ileocecal valve. The                            colonoscopy was performed without difficulty. The                            patient tolerated the procedure well. The quality                            of the bowel preparation was adequate. The bowel                            preparation used was Miralax via single dose                            instruction. The ileocecal valve, appendiceal                            orifice, and rectum were photographed. Scope In: 7:37:13 AM Scope Out: 7:50:10 AM Scope Withdrawal Time: 0 hours 9 minutes 28 seconds  Total Procedure Duration: 0 hours 12 minutes 57 seconds  Findings:                 The perianal and digital rectal examinations were                            normal. Pertinent negatives include normal prostate                            (size, shape, and consistency).                           A diminutive polyp was found in the transverse                            colon. The polyp was sessile. The polyp was  removed                            with a cold snare. Resection and retrieval were                            complete. Verification of patient identification                            for the specimen was done. Estimated blood loss was                            minimal.                           Scattered diverticula were found in the entire                            colon.                           Internal hemorrhoids were found.                           The exam was otherwise without abnormality on                            direct and retroflexion views. Complications:            No immediate complications. Estimated Blood Loss:     Estimated blood loss was minimal. Impression:               - One diminutive polyp in the transverse colon,                             removed with a cold snare. Resected and retrieved.                           - Diverticulosis in the entire examined colon.                           - Internal hemorrhoids.                           - The examination was otherwise normal on direct                            and retroflexion views.                           - Personal history of colonic polyp 2015 - 7 mm                            hypewrplastic ascending - clinical impressions was                            sessile serrated polyp. Recommendation:           -  Patient has a contact number available for                            emergencies. The signs and symptoms of potential                            delayed complications were discussed with the                            patient. Return to normal activities tomorrow.                            Written discharge instructions were provided to the                            patient.                           - Resume previous diet.                           - Continue present medications.                           - Repeat colonoscopy is recommended for                            surveillance. The colonoscopy date will be                            determined after pathology results from today's                            exam become available for review.                           He requested hemorrhoid treatment when we discussed                            in recovery - will arrange a banding appointment. Gatha Mayer, MD 09/03/2018 7:57:51 AM This report has been signed electronically.

## 2018-09-03 NOTE — Progress Notes (Signed)
Pt's states no medical or surgical changes since previsit or office visit.  JB temps and CW vitals. 

## 2018-09-03 NOTE — Progress Notes (Signed)
To PACU, VSS. Report to [email or web address].tb 

## 2018-09-05 ENCOUNTER — Telehealth: Payer: Self-pay

## 2018-09-05 NOTE — Telephone Encounter (Signed)
Returning call. Tells me he is doing very well and had no problems or issues. Inquired about the hemorrhoid banding physician suggested. I scheduled appointment for 09-16-2018 at 3:10pm.

## 2018-09-05 NOTE — Telephone Encounter (Signed)
  Follow up Call-  Call back number 09/03/2018  Post procedure Call Back phone  # [phone]  Permission to leave phone message Yes  Some recent data might be hidden     Left message

## 2018-09-07 ENCOUNTER — Encounter: Payer: Self-pay | Admitting: Internal Medicine

## 2018-09-07 NOTE — Progress Notes (Signed)
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

## 2018-09-16 ENCOUNTER — Encounter: Payer: Self-pay | Admitting: Internal Medicine

## 2018-09-16 ENCOUNTER — Ambulatory Visit (INDEPENDENT_AMBULATORY_CARE_PROVIDER_SITE_OTHER): Payer: BC Managed Care – PPO | Admitting: Internal Medicine

## 2018-09-16 ENCOUNTER — Other Ambulatory Visit: Payer: Self-pay

## 2018-09-16 VITALS — BP 118/78 | HR 82 | Temp 98.1°F | Ht 65.0 in | Wt 192.4 lb

## 2018-09-16 DIAGNOSIS — K648 Other hemorrhoids: Secondary | ICD-10-CM

## 2018-09-16 NOTE — Progress Notes (Deleted)
   Rodney Burgess 58 y.o. 1960/08/27 [id number]  Assessment & Plan:      Subjective:   Chief Complaint:  HPI  No Known Allergies No outpatient medications have been marked as taking for the 09/16/18 encounter (Procedure visit) with Gatha Mayer, MD.   Past Medical History:  Diagnosis Date  . Acute bronchitis   . Contact dermatitis and other eczema due to plants (except food)   . Localized superficial swelling, mass, or lump   . Pain in joint, shoulder region   . Personal history of colonic polyp - right sided hyperplastic (serrated) 07/09/2013  . Syncope and collapse    Past Surgical History:  Procedure Laterality Date  . COLONOSCOPY    . UMBILICAL HERNIA REPAIR     Social History   Social History Narrative   Lives at home with wife and two daughters.     family history is not on file.   Review of Systems   Objective:   Physical Exam

## 2018-09-16 NOTE — Progress Notes (Signed)
   HEMORRHOID LIGATION  [facility] - BLEEDING AND PROLAPSE COLONOSCOPY 08/2018  Rectal no mass, non-tender  Anoscopy - Gr 2 inflamed internal hemorrhoids, RP and LL moreso than RA Some inflamed external components also   PROCEDURE NOTE: The patient presents with symptomatic grade 2  hemorrhoids, requesting rubber band ligation of his/her hemorrhoidal disease.  All risks, benefits and alternative forms of therapy were described and informed consent was obtained.   The anorectum was pre-medicated with 0.125% NTG and 5% lidoacine The decision was made to band the RP  internal hemorrhoid, and the [place] was used to perform band ligation without complication.  Digital anorectal examination was then performed to assure proper positioning of the band, and to adjust the banded tissue as required.  The patient was discharged home without pain or other issues.  Dietary and behavioral recommendations were given and along with follow-up instructions.      The patient will return in 2 weeks approximately for  follow-up and possible additional banding as required. No complications were encountered and the patient tolerated the procedure well.  I appreciate the opportunity to care for him.  FT:DDUKGUR, Danae Chen, DO

## 2018-09-16 NOTE — Patient Instructions (Addendum)
HEMORRHOID BANDING PROCEDURE    FOLLOW-UP CARE   1. The procedure you have had should have been relatively painless since the banding of the area involved does not have nerve endings and there is no pain sensation.  The rubber band cuts off the blood supply to the hemorrhoid and the band may fall off as soon as 48 hours after the banding (the band may occasionally be seen in the toilet bowl following a bowel movement). You may notice a temporary feeling of fullness in the rectum which should respond adequately to plain Tylenol or Motrin.  2. Following the banding, avoid strenuous exercise that evening and resume full activity the next day.  A sitz bath (soaking in a warm tub) or bidet is soothing, and can be useful for cleansing the area after bowel movements.     3. To avoid constipation, take two tablespoons of natural wheat bran, natural oat bran, flax, Benefiber or any over the counter fiber supplement and increase your water intake to 7-8 glasses daily.    4. Unless you have been prescribed anorectal medication, do not put anything inside your rectum for two weeks: No suppositories, enemas, fingers, etc.  5. Occasionally, you may have more bleeding than usual after the banding procedure.  This is often from the untreated hemorrhoids rather than the treated one.  Don't be concerned if there is a tablespoon or so of blood.  If there is more blood than this, lie flat with your bottom higher than your head and apply an ice pack to the area. If the bleeding does not stop within a half an hour or if you feel faint, call our office at [phone] or go to the emergency room.  6. Problems are not common; however, if there is a substantial amount of bleeding, severe pain, chills, fever or difficulty passing urine (very rare) or other problems, you should call us at [phone] or report to the nearest emergency room.  7. Do not stay seated continuously for more than 2-3 hours for a day or two  after the procedure.  Tighten your buttock muscles 10-15 times every two hours and take 10-15 deep breaths every 1-2 hours.  Do not spend more than a few minutes on the toilet if you cannot empty your bowel; instead re-visit the toilet at a later time.    I appreciate the opportunity to care for you.  Silvano Rusk, MD, [facility]

## 2018-09-20 ENCOUNTER — Encounter: Payer: Self-pay | Admitting: Internal Medicine

## 2018-09-20 DIAGNOSIS — K648 Other hemorrhoids: Secondary | ICD-10-CM

## 2018-09-20 HISTORY — DX: Other hemorrhoids: K64.8

## 2018-09-20 NOTE — Assessment & Plan Note (Signed)
RP banded Return about 2 weeks additional banding

## 2018-10-02 ENCOUNTER — Ambulatory Visit: Payer: BC Managed Care – PPO | Admitting: Internal Medicine

## 2018-10-02 ENCOUNTER — Encounter: Payer: BC Managed Care – PPO | Admitting: Internal Medicine

## 2018-10-02 ENCOUNTER — Encounter: Payer: Self-pay | Admitting: Internal Medicine

## 2018-10-02 VITALS — BP 112/68 | HR 78 | Temp 98.5°F | Ht 65.0 in | Wt 189.1 lb

## 2018-10-02 DIAGNOSIS — K648 Other hemorrhoids: Secondary | ICD-10-CM | POA: Diagnosis not present

## 2018-10-02 NOTE — Progress Notes (Signed)
   HEMORRHOID LIGATION Symptoms were bleeding and prolapse.  Right posterior was banded August 18.  Symptoms are improved.  Previous colonoscopy August 5.   PROCEDURE NOTE: The patient presents with symptomatic grade 2  hemorrhoids, requesting rubber band ligation of his/her hemorrhoidal disease.  All risks, benefits and alternative forms of therapy were described and informed consent was obtained.   The anorectum was pre-medicated with 0.125% nitroglycerin and 5% lidocaine The decision was made to band the right anterior and left lateral internal hemorrhoids, and the [place] was used to perform band ligation without complication.   However the right anterior band would not stay in place so that was abandoned. Digital anorectal examination was then performed to assure proper positioning of the band, and to adjust the banded tissue as required.  The patient was discharged home without pain or other issues.  Dietary and behavioral recommendations were given and along with follow-up instructions.       The patient will return as needed for  follow-up and possible additional banding as required. No complications were encountered and the patient tolerated the procedure well.   I appreciate the opportunity to care for this patient. CC: Briscoe Deutscher, DO

## 2018-10-02 NOTE — Assessment & Plan Note (Signed)
Banded LL RA banded too low - tender - removed  Observe - if not all better in mid October call for appointment

## 2018-10-02 NOTE — Patient Instructions (Addendum)
I banded one more hemorrhoid today.  If you are not feeling great regarding the hemorrhoids as of middle of October (around the 15th or so)  then call for an appointment again.  I appreciate the opportunity to care for you. Gatha Mayer, MD, FACG  HEMORRHOID BANDING PROCEDURE    FOLLOW-UP CARE   1. The procedure you have had should have been relatively painless since the banding of the area involved does not have nerve endings and there is no pain sensation.  The rubber band cuts off the blood supply to the hemorrhoid and the band may fall off as soon as 48 hours after the banding (the band may occasionally be seen in the toilet bowl following a bowel movement). You may notice a temporary feeling of fullness in the rectum which should respond adequately to plain Tylenol or Motrin.  2. Following the banding, avoid strenuous exercise that evening and resume full activity the next day.  A sitz bath (soaking in a warm tub) or bidet is soothing, and can be useful for cleansing the area after bowel movements.     3. To avoid constipation, take two tablespoons of natural wheat bran, natural oat bran, flax, Benefiber or any over the counter fiber supplement and increase your water intake to 7-8 glasses daily.    4. Unless you have been prescribed anorectal medication, do not put anything inside your rectum for two weeks: No suppositories, enemas, fingers, etc.  5. Occasionally, you may have more bleeding than usual after the banding procedure.  This is often from the untreated hemorrhoids rather than the treated one.  Don't be concerned if there is a tablespoon or so of blood.  If there is more blood than this, lie flat with your bottom higher than your head and apply an ice pack to the area. If the bleeding does not stop within a half an hour or if you feel faint, call our office at [phone] or go to the emergency room.  6. Problems are not common; however, if there is a substantial  amount of bleeding, severe pain, chills, fever or difficulty passing urine (very rare) or other problems, you should call us at [phone] or report to the nearest emergency room.  7. Do not stay seated continuously for more than 2-3 hours for a day or two after the procedure.  Tighten your buttock muscles 10-15 times every two hours and take 10-15 deep breaths every 1-2 hours.  Do not spend more than a few minutes on the toilet if you cannot empty your bowel; instead re-visit the toilet at a later time.    I appreciate the opportunity to care for you. Silvano Rusk, MD, [facility]

## 2019-02-26 ENCOUNTER — Ambulatory Visit: Payer: BC Managed Care – PPO | Admitting: Emergency Medicine

## 2019-02-26 ENCOUNTER — Other Ambulatory Visit: Payer: Self-pay

## 2019-03-02 ENCOUNTER — Encounter: Payer: Self-pay | Admitting: Emergency Medicine

## 2019-04-11 ENCOUNTER — Ambulatory Visit: Payer: BC Managed Care – PPO | Attending: Internal Medicine

## 2019-04-11 DIAGNOSIS — Z23 Encounter for immunization: Secondary | ICD-10-CM

## 2019-04-11 NOTE — Progress Notes (Signed)
   Covid-19 Vaccination Clinic  Name:  Rodney Burgess    MRN: [id number] DOB: Mar 31, 1960  04/11/2019  Rodney Burgess was observed post Covid-19 immunization for 15 minutes without incident. He was provided with Vaccine Information Sheet and instruction to access the V-Safe system.   Rodney Burgess was instructed to call 911 with any severe reactions post vaccine: Marland Kitchen Difficulty breathing  . Swelling of face and throat  . A fast heartbeat  . A bad rash all over body  . Dizziness and weakness   Immunizations Administered    Name Date Dose VIS Date Route   Pfizer COVID-19 Vaccine 04/11/2019 11:47 AM 0.3 mL 01/09/2019 Intramuscular   Manufacturer: [place]   Lot: [id number]   [place]: [id number]

## 2019-05-05 ENCOUNTER — Ambulatory Visit: Payer: BC Managed Care – PPO | Attending: Internal Medicine

## 2019-05-05 DIAGNOSIS — Z23 Encounter for immunization: Secondary | ICD-10-CM

## 2019-05-05 NOTE — Progress Notes (Signed)
   Covid-19 Vaccination Clinic  Name:  Rodney Burgess    MRN: [id number] DOB: [birth year]  05/05/2019  Mr. [name] was observed post Covid-19 immunization for 15 minutes without incident. He was provided with Vaccine Information Sheet and instruction to access the V-Safe system.   Mr. [name] was instructed to call 911 with any severe reactions post vaccine: Marland Kitchen Difficulty breathing  . Swelling of face and throat  . A fast heartbeat  . A bad rash all over body  . Dizziness and weakness   Immunizations Administered    Name Date Dose VIS Date Route   Pfizer COVID-19 Vaccine 05/05/2019 10:47 AM 0.3 mL 01/09/2019 Intramuscular   Manufacturer: [place]   Lot: [id number]   [place]: [id number]

## 2019-06-17 ENCOUNTER — Encounter: Payer: Self-pay | Admitting: Internal Medicine

## 2019-06-17 ENCOUNTER — Telehealth: Payer: Self-pay | Admitting: Internal Medicine

## 2019-06-17 ENCOUNTER — Ambulatory Visit (INDEPENDENT_AMBULATORY_CARE_PROVIDER_SITE_OTHER): Payer: BC Managed Care – PPO | Admitting: Internal Medicine

## 2019-06-17 ENCOUNTER — Other Ambulatory Visit: Payer: Self-pay

## 2019-06-17 VITALS — BP 102/68 | HR 76 | Temp 97.8°F | Ht 65.0 in | Wt 196.5 lb

## 2019-06-17 DIAGNOSIS — Z0000 Encounter for general adult medical examination without abnormal findings: Secondary | ICD-10-CM

## 2019-06-17 DIAGNOSIS — E669 Obesity, unspecified: Secondary | ICD-10-CM | POA: Diagnosis not present

## 2019-06-17 DIAGNOSIS — M545 Low back pain, unspecified: Secondary | ICD-10-CM

## 2019-06-17 LAB — COMPREHENSIVE METABOLIC PANEL
ALT: 27 U/L (ref 0–53)
AST: 19 U/L (ref 0–37)
Albumin: 4.3 g/dL (ref 3.5–5.2)
Alkaline Phosphatase: 86 U/L (ref 39–117)
BUN: 15 mg/dL (ref 6–23)
CO2: 26 mEq/L (ref 19–32)
Calcium: 9.4 mg/dL (ref 8.4–10.5)
Chloride: 104 mEq/L (ref 96–112)
Creatinine, Ser: 0.84 mg/dL (ref 0.40–1.50)
GFR: 94.14 mL/min (ref >60.00)
Glucose, Bld: 80 mg/dL (ref 70–99)
Potassium: 4.3 mEq/L (ref 3.5–5.1)
Sodium: 138 mEq/L (ref 135–145)
Total Bilirubin: 0.7 mg/dL (ref 0.2–1.2)
Total Protein: 7 g/dL (ref 6.0–8.3)

## 2019-06-17 LAB — LIPID PANEL
Cholesterol: 206 mg/dL — ABNORMAL HIGH (ref 0–200)
HDL: 35.8 mg/dL — ABNORMAL LOW (ref >39.00)
NonHDL: 170.13
Total CHOL/HDL Ratio: 6
Triglycerides: 357 mg/dL — ABNORMAL HIGH (ref 0.0–149.0)
VLDL: 71.4 mg/dL — ABNORMAL HIGH (ref 0.0–40.0)

## 2019-06-17 LAB — CBC WITH DIFFERENTIAL/PLATELET
Basophils Absolute: 0.1 10*3/uL (ref 0.0–0.1)
Basophils Relative: 1.2 % (ref 0.0–3.0)
Eosinophils Absolute: 0.3 10*3/uL (ref 0.0–0.7)
Eosinophils Relative: 3 % (ref 0.0–5.0)
HCT: 46.4 % (ref 39.0–52.0)
Hemoglobin: 15.8 g/dL (ref 13.0–17.0)
Lymphocytes Relative: 43.2 % (ref 12.0–46.0)
Lymphs Abs: 4.3 10*3/uL — ABNORMAL HIGH (ref 0.7–4.0)
MCHC: 34.1 g/dL (ref 30.0–36.0)
MCV: 87.7 fl (ref 78.0–100.0)
Monocytes Absolute: 1.2 10*3/uL — ABNORMAL HIGH (ref 0.1–1.0)
Monocytes Relative: 11.7 % (ref 3.0–12.0)
Neutro Abs: 4.1 10*3/uL (ref 1.4–7.7)
Neutrophils Relative %: 40.9 % — ABNORMAL LOW (ref 43.0–77.0)
Platelets: 234 10*3/uL (ref 150.0–400.0)
RBC: 5.29 Mil/uL (ref 4.22–5.81)
RDW: 14.1 % (ref 11.5–15.5)
WBC: 10 10*3/uL (ref 4.0–10.5)

## 2019-06-17 LAB — POCT URINALYSIS DIPSTICK
Bilirubin, UA: NEGATIVE
Blood, UA: NEGATIVE
Glucose, UA: NEGATIVE
Leukocytes, UA: NEGATIVE
Nitrite, UA: NEGATIVE
Protein, UA: POSITIVE — AB
Spec Grav, UA: >=1.03 — AB (ref 1.010–1.025)
Urobilinogen, UA: 0.2 E.U./dL
pH, UA: 6 (ref 5.0–8.0)

## 2019-06-17 LAB — VITAMIN B12: Vitamin B-12: 205 pg/mL — ABNORMAL LOW (ref 211–911)

## 2019-06-17 LAB — VITAMIN D 25 HYDROXY (VIT D DEFICIENCY, FRACTURES): VITD: 27.71 ng/mL — ABNORMAL LOW (ref 30.00–100.00)

## 2019-06-17 LAB — TSH: TSH: 3.36 u[IU]/mL (ref 0.35–4.50)

## 2019-06-17 LAB — PSA: PSA: 0.67 ng/mL (ref 0.10–4.00)

## 2019-06-17 LAB — HEMOGLOBIN A1C: Hgb A1c MFr Bld: 6.1 % (ref 4.6–6.5)

## 2019-06-17 LAB — LDL CHOLESTEROL, DIRECT: Direct LDL: 114 mg/dL

## 2019-06-17 NOTE — Telephone Encounter (Signed)
Pt wanted to schedule for the shingles vaccine. I did not see anything in the after visit summary, sending note back to be sure he is ok to get it?   You can route message back to me, thank you.

## 2019-06-17 NOTE — Addendum Note (Signed)
Addended by: Elmer Picker on: 06/17/2019 09:44 AM   Modules accepted: Orders

## 2019-06-17 NOTE — Progress Notes (Signed)
New Patient Office Visit     This visit occurred during the SARS-CoV-2 public health emergency.  Safety protocols were in place, including screening questions prior to the visit, additional usage of staff PPE, and extensive cleaning of exam room while observing appropriate contact time as indicated for disinfecting solutions.    CC/Reason for Visit: Establish care, annual preventive exam Previous PCP: Briscoe Deutscher, DO Last Visit: 2020  HPI: Rodney Burgess is a 59 y.o. male who is coming in today for the above mentioned reasons.  He has no past medical history of significance.  He had an internal hemorrhoid that was ligated last year by Dr. Carlean Purl.  He has had both of his Covid vaccines.  He had a colonoscopy in 2020 and a 5-year callback.  For the past 2 days has been having right lower back pain no radiation, only urinary complaint is foul-smelling urine.  No fever, no nausea, no vomiting, no abdominal pain.   Past Medical/Surgical History: Past Medical History:  Diagnosis Date  . Acute bronchitis   . Contact dermatitis and other eczema due to plants (except food)   . Internal hemorrhoids   . Localized superficial swelling, mass, or lump   . Pain in joint, shoulder region   . Personal history of colonic polyp - right sided hyperplastic (serrated) 07/09/2013  . Prolapsed internal hemorrhoids 09/20/2018   Gr 2 internals and some external components  . Syncope and collapse     Past Surgical History:  Procedure Laterality Date  . COLONOSCOPY    . HEMORRHOID BANDING  08/2018  . UMBILICAL HERNIA REPAIR      Social History:  reports that he has quit smoking. He has never used smokeless tobacco. He reports current alcohol use. He reports that he does not use drugs.  Allergies: No Known Allergies  Family History:  Family History  Problem Relation Age of Onset  . Colon cancer Neg Hx   . Esophageal cancer Neg Hx   . Rectal cancer Neg Hx   . Stomach cancer Neg Hx     No  current outpatient medications on file.  Review of Systems:  Constitutional: Denies fever, chills, diaphoresis, appetite change and fatigue.  HEENT: Denies photophobia, eye pain, redness, hearing loss, ear pain, congestion, sore throat, rhinorrhea, sneezing, mouth sores, trouble swallowing, neck pain, neck stiffness and tinnitus.   Respiratory: Denies SOB, DOE, cough, chest tightness,  and wheezing.   Cardiovascular: Denies chest pain, palpitations and leg swelling.  Gastrointestinal: Denies nausea, vomiting, abdominal pain, diarrhea, constipation, blood in stool and abdominal distention.  Genitourinary: Denies dysuria, urgency, frequency, hematuria, flank pain and difficulty urinating.  Endocrine: Denies: hot or cold intolerance, sweats, changes in hair or nails, polyuria, polydipsia. Musculoskeletal: Denies myalgias, back pain, joint swelling, arthralgias and gait problem.  Skin: Denies pallor, rash and wound.  Neurological: Denies dizziness, seizures, syncope, weakness, light-headedness, numbness and headaches.  Hematological: Denies adenopathy. Easy bruising, personal or family bleeding history  Psychiatric/Behavioral: Denies suicidal ideation, mood changes, confusion, nervousness, sleep disturbance and agitation    Physical Exam: Vitals:   06/17/19 0905  BP: 102/68  Pulse: 76  Temp: 97.8 F (36.6 C)  TempSrc: Temporal  SpO2: 96%  Weight: 196 lb 8 oz (89.1 kg)  Height: 5' 5" (1.651 m)   Body mass index is 32.7 kg/m.  Constitutional: NAD, calm, comfortable Eyes: PERRL, lids and conjunctivae normal ENMT: Mucous membranes are moist.Tympanic membrane is pearly white, no erythema or bulging. Neck: normal, supple, no masses,  no thyromegaly Respiratory: clear to auscultation bilaterally, no wheezing, no crackles. Normal respiratory effort. No accessory muscle use.  Cardiovascular: Regular rate and rhythm, no murmurs / rubs / gallops. No extremity edema. 2+ pedal pulses. No carotid  bruits.  Abdomen: no tenderness, no masses palpated. No hepatosplenomegaly. Bowel sounds positive.  Musculoskeletal: no clubbing / cyanosis. No joint deformity upper and lower extremities. Good ROM, no contractures. Normal muscle tone.  Skin: no rashes, lesions, ulcers. No induration Neurologic: CN 2-12 grossly intact. Sensation intact, DTR normal. Strength 5/5 in all 4.  Psychiatric: Normal judgment and insight. Alert and oriented x 3. Normal mood.    Impression and Plan:  Encounter for preventive health examination -He has routine eye and dental care. -Immunizations are up-to-date with the exception of shingles which she is not interested in receiving today. -Screening labs today. -Healthy lifestyle discussed in detail. -Had a colonoscopy in 2020 was a 5-year callback. -Check PSA today.  Acute right-sided low back pain without sciatica -Advised icing, ibuprofen, back stretches, local massage therapy.  He will return in 14 days or so if no improvement.  Obesity (BMI 30.0-34.9) -Discussed healthy lifestyle, including increased physical activity and better food choices to promote weight loss.      Patient Instructions  -Nice seeing you today!!  -Lab work today; will notify you once results are available.  -See you back in 1 year or sooner as needed.  -For your back: icing, ibuprofen, back stretches and massage therapy.   Preventive Care 57-71 Years Old, Male Preventive care refers to lifestyle choices and visits with your health care provider that can promote health and wellness. This includes:  A yearly physical exam. This is also called an annual well check.  Regular dental and eye exams.  Immunizations.  Screening for certain conditions.  Healthy lifestyle choices, such as eating a healthy diet, getting regular exercise, not using drugs or products that contain nicotine and tobacco, and limiting alcohol use. What can I expect for my preventive care visit? Physical  exam Your health care provider will check:  Height and weight. These may be used to calculate body mass index (BMI), which is a measurement that tells if you are at a healthy weight.  Heart rate and blood pressure.  Your skin for abnormal spots. Counseling Your health care provider may ask you questions about:  Alcohol, tobacco, and drug use.  Emotional well-being.  Home and relationship well-being.  Sexual activity.  Eating habits.  Work and work Statistician. What immunizations do I need?  Influenza (flu) vaccine  This is recommended every year. Tetanus, diphtheria, and pertussis (Tdap) vaccine  You may need a Td booster every 10 years. Varicella (chickenpox) vaccine  You may need this vaccine if you have not already been vaccinated. Zoster (shingles) vaccine  You may need this after age 35. Measles, mumps, and rubella (MMR) vaccine  You may need at least one dose of MMR if you were born in 1957 or later. You may also need a second dose. Pneumococcal conjugate (PCV13) vaccine  You may need this if you have certain conditions and were not previously vaccinated. Pneumococcal polysaccharide (PPSV23) vaccine  You may need one or two doses if you smoke cigarettes or if you have certain conditions. Meningococcal conjugate (MenACWY) vaccine  You may need this if you have certain conditions. Hepatitis A vaccine  You may need this if you have certain conditions or if you travel or work in places where you may be exposed to hepatitis  A. Hepatitis B vaccine  You may need this if you have certain conditions or if you travel or work in places where you may be exposed to hepatitis B. Haemophilus influenzae type b (Hib) vaccine  You may need this if you have certain risk factors. Human papillomavirus (HPV) vaccine  If recommended by your health care provider, you may need three doses over 6 months. You may receive vaccines as individual doses or as more than one vaccine  together in one shot (combination vaccines). Talk with your health care provider about the risks and benefits of combination vaccines. What tests do I need? Blood tests  Lipid and cholesterol levels. These may be checked every 5 years, or more frequently if you are over 64 years old.  Hepatitis C test.  Hepatitis B test. Screening  Lung cancer screening. You may have this screening every year starting at age 28 if you have a 30-pack-year history of smoking and currently smoke or have quit within the past 15 years.  Prostate cancer screening. Recommendations will vary depending on your family history and other risks.  Colorectal cancer screening. All adults should have this screening starting at age 54 and continuing until age 50. Your health care provider may recommend screening at age 40 if you are at increased risk. You will have tests every 1-10 years, depending on your results and the type of screening test.  Diabetes screening. This is done by checking your blood sugar (glucose) after you have not eaten for a while (fasting). You may have this done every 1-3 years.  Sexually transmitted disease (STD) testing. Follow these instructions at home: Eating and drinking  Eat a diet that includes fresh fruits and vegetables, whole grains, lean protein, and low-fat dairy products.  Take vitamin and mineral supplements as recommended by your health care provider.  Do not drink alcohol if your health care provider tells you not to drink.  If you drink alcohol: ? Limit how much you have to 0-2 drinks a day. ? Be aware of how much alcohol is in your drink. In the [place] one drink equals one 12 oz bottle of beer (355 mL), one 5 oz glass of wine (148 mL), or one 1 oz glass of hard liquor (44 mL). Lifestyle  Take daily care of your teeth and gums.  Stay active. Exercise for at least 30 minutes on 5 or more days each week.  Do not use any products that contain nicotine or tobacco, such as  cigarettes, e-cigarettes, and chewing tobacco. If you need help quitting, ask your health care provider.  If you are sexually active, practice safe sex. Use a condom or other form of protection to prevent STIs (sexually transmitted infections).  Talk with your health care provider about taking a low-dose aspirin every day starting at age 70. What's next?  Go to your health care provider once a year for a well check visit.  Ask your health care provider how often you should have your eyes and teeth checked.  Stay up to date on all vaccines. This information is not intended to replace advice given to you by your health care provider. Make sure you discuss any questions you have with your health care provider. Document Revised: 01/09/2018 Document Reviewed: 01/09/2018 Elsevier Patient Education  2020 [place], [place] [facility]

## 2019-06-17 NOTE — Addendum Note (Signed)
Addended by: Westley Hummer B on: 06/17/2019 11:04 AM   Modules accepted: Orders

## 2019-06-17 NOTE — Telephone Encounter (Signed)
Patient declined a shingles vaccine today at his visit, but okay to schedule.

## 2019-06-17 NOTE — Patient Instructions (Signed)
-Nice seeing you today!!  -Lab work today; will notify you once results are available.  -See you back in 1 year or sooner as needed.  -For your back: icing, ibuprofen, back stretches and massage therapy.   Preventive Care 57-57 Years Old, Male Preventive care refers to lifestyle choices and visits with your health care provider that can promote health and wellness. This includes:  A yearly physical exam. This is also called an annual well check.  Regular dental and eye exams.  Immunizations.  Screening for certain conditions.  Healthy lifestyle choices, such as eating a healthy diet, getting regular exercise, not using drugs or products that contain nicotine and tobacco, and limiting alcohol use. What can I expect for my preventive care visit? Physical exam Your health care provider will check:  Height and weight. These may be used to calculate body mass index (BMI), which is a measurement that tells if you are at a healthy weight.  Heart rate and blood pressure.  Your skin for abnormal spots. Counseling Your health care provider may ask you questions about:  Alcohol, tobacco, and drug use.  Emotional well-being.  Home and relationship well-being.  Sexual activity.  Eating habits.  Work and work Statistician. What immunizations do I need?  Influenza (flu) vaccine  This is recommended every year. Tetanus, diphtheria, and pertussis (Tdap) vaccine  You may need a Td booster every 10 years. Varicella (chickenpox) vaccine  You may need this vaccine if you have not already been vaccinated. Zoster (shingles) vaccine  You may need this after age 106. Measles, mumps, and rubella (MMR) vaccine  You may need at least one dose of MMR if you were born in 1957 or later. You may also need a second dose. Pneumococcal conjugate (PCV13) vaccine  You may need this if you have certain conditions and were not previously vaccinated. Pneumococcal polysaccharide (PPSV23)  vaccine  You may need one or two doses if you smoke cigarettes or if you have certain conditions. Meningococcal conjugate (MenACWY) vaccine  You may need this if you have certain conditions. Hepatitis A vaccine  You may need this if you have certain conditions or if you travel or work in places where you may be exposed to hepatitis A. Hepatitis B vaccine  You may need this if you have certain conditions or if you travel or work in places where you may be exposed to hepatitis B. Haemophilus influenzae type b (Hib) vaccine  You may need this if you have certain risk factors. Human papillomavirus (HPV) vaccine  If recommended by your health care provider, you may need three doses over 6 months. You may receive vaccines as individual doses or as more than one vaccine together in one shot (combination vaccines). Talk with your health care provider about the risks and benefits of combination vaccines. What tests do I need? Blood tests  Lipid and cholesterol levels. These may be checked every 5 years, or more frequently if you are over 51 years old.  Hepatitis C test.  Hepatitis B test. Screening  Lung cancer screening. You may have this screening every year starting at age 57 if you have a 30-pack-year history of smoking and currently smoke or have quit within the past 15 years.  Prostate cancer screening. Recommendations will vary depending on your family history and other risks.  Colorectal cancer screening. All adults should have this screening starting at age 83 and continuing until age 74. Your health care provider may recommend screening at age 29 if  you are at increased risk. You will have tests every 1-10 years, depending on your results and the type of screening test.  Diabetes screening. This is done by checking your blood sugar (glucose) after you have not eaten for a while (fasting). You may have this done every 1-3 years.  Sexually transmitted disease (STD)  testing. Follow these instructions at home: Eating and drinking  Eat a diet that includes fresh fruits and vegetables, whole grains, lean protein, and low-fat dairy products.  Take vitamin and mineral supplements as recommended by your health care provider.  Do not drink alcohol if your health care provider tells you not to drink.  If you drink alcohol: ? Limit how much you have to 0-2 drinks a day. ? Be aware of how much alcohol is in your drink. In the [place] one drink equals one 12 oz bottle of beer (355 mL), one 5 oz glass of wine (148 mL), or one 1 oz glass of hard liquor (44 mL). Lifestyle  Take daily care of your teeth and gums.  Stay active. Exercise for at least 30 minutes on 5 or more days each week.  Do not use any products that contain nicotine or tobacco, such as cigarettes, e-cigarettes, and chewing tobacco. If you need help quitting, ask your health care provider.  If you are sexually active, practice safe sex. Use a condom or other form of protection to prevent STIs (sexually transmitted infections).  Talk with your health care provider about taking a low-dose aspirin every day starting at age 19. What's next?  Go to your health care provider once a year for a well check visit.  Ask your health care provider how often you should have your eyes and teeth checked.  Stay up to date on all vaccines. This information is not intended to replace advice given to you by your health care provider. Make sure you discuss any questions you have with your health care provider. Document Revised: 01/09/2018 Document Reviewed: 01/09/2018 Elsevier Patient Education  2020 [employer].

## 2019-06-18 ENCOUNTER — Other Ambulatory Visit: Payer: Self-pay | Admitting: Internal Medicine

## 2019-06-18 ENCOUNTER — Encounter: Payer: Self-pay | Admitting: Internal Medicine

## 2019-06-18 DIAGNOSIS — E559 Vitamin D deficiency, unspecified: Secondary | ICD-10-CM

## 2019-06-18 DIAGNOSIS — R7302 Impaired glucose tolerance (oral): Secondary | ICD-10-CM | POA: Insufficient documentation

## 2019-06-18 DIAGNOSIS — E119 Type 2 diabetes mellitus without complications: Secondary | ICD-10-CM | POA: Insufficient documentation

## 2019-06-18 DIAGNOSIS — E538 Deficiency of other specified B group vitamins: Secondary | ICD-10-CM | POA: Insufficient documentation

## 2019-06-18 MED ORDER — VITAMIN D (ERGOCALCIFEROL) 1.25 MG (50000 UNIT) PO CAPS: 50000.0000 [IU] | ORAL_CAPSULE | ORAL | 0 refills | Status: AC

## 2019-06-22 ENCOUNTER — Ambulatory Visit (INDEPENDENT_AMBULATORY_CARE_PROVIDER_SITE_OTHER): Payer: BC Managed Care – PPO

## 2019-06-22 ENCOUNTER — Other Ambulatory Visit: Payer: Self-pay

## 2019-06-22 DIAGNOSIS — Z23 Encounter for immunization: Secondary | ICD-10-CM | POA: Diagnosis not present

## 2019-06-22 NOTE — Patient Instructions (Signed)
There are no preventive care reminders to display for this patient.  Depression screen PHQ 2/9 06/17/2019 10/23/2017 07/05/2016  Decreased Interest 0 0 0  Down, Depressed, Hopeless 0 0 0  PHQ - 2 Score 0 0 0  Altered sleeping 0 0 -  Tired, decreased energy 1 0 -  Change in appetite 0 0 -  Feeling bad or failure about yourself  0 0 -  Trouble concentrating 0 0 -  Moving slowly or fidgety/restless 0 0 -  Suicidal thoughts 0 0 -  PHQ-9 Score 1 0 -  Difficult doing work/chores Not difficult at all - -   

## 2019-06-22 NOTE — Progress Notes (Signed)
Per orders of Dr. Jerilee Hoh, injection of Shingrix given by Franco Collet. Patient tolerated injection well.

## 2019-06-24 ENCOUNTER — Other Ambulatory Visit: Payer: Self-pay | Admitting: Internal Medicine

## 2019-06-24 DIAGNOSIS — E559 Vitamin D deficiency, unspecified: Secondary | ICD-10-CM

## 2019-06-25 ENCOUNTER — Other Ambulatory Visit: Payer: Self-pay

## 2019-06-26 ENCOUNTER — Ambulatory Visit (INDEPENDENT_AMBULATORY_CARE_PROVIDER_SITE_OTHER): Payer: BC Managed Care – PPO

## 2019-06-26 DIAGNOSIS — E538 Deficiency of other specified B group vitamins: Secondary | ICD-10-CM | POA: Diagnosis not present

## 2019-06-26 MED ORDER — CYANOCOBALAMIN 1000 MCG/ML IJ SOLN
1000.0000 ug | Freq: Once | INTRAMUSCULAR | Status: AC
  Administered 2019-06-26: 1000 ug via INTRAMUSCULAR

## 2019-06-26 NOTE — Progress Notes (Signed)
Per orders of Dr. Hernandez, injection of B12 given by Lindsay  Hedrick. Patient tolerated injection well.  

## 2019-07-01 ENCOUNTER — Other Ambulatory Visit: Payer: Self-pay

## 2019-07-02 ENCOUNTER — Ambulatory Visit (INDEPENDENT_AMBULATORY_CARE_PROVIDER_SITE_OTHER): Payer: BC Managed Care – PPO

## 2019-07-02 DIAGNOSIS — E538 Deficiency of other specified B group vitamins: Secondary | ICD-10-CM | POA: Diagnosis not present

## 2019-07-02 MED ORDER — CYANOCOBALAMIN 1000 MCG/ML IJ SOLN
1000.0000 ug | Freq: Once | INTRAMUSCULAR | Status: AC
  Administered 2019-07-02: 1000 ug via INTRAMUSCULAR

## 2019-07-02 NOTE — Progress Notes (Signed)
Per orders of Dr. Isaac Bliss, injection of vitamin B12 was given in left deltoid per patient request as he stated that his right deltoid was still sore from his vitamin D injection from 3 weeks ago. Patient tolerated injection well.

## 2019-07-09 ENCOUNTER — Other Ambulatory Visit: Payer: Self-pay

## 2019-07-10 ENCOUNTER — Ambulatory Visit (INDEPENDENT_AMBULATORY_CARE_PROVIDER_SITE_OTHER): Payer: BC Managed Care – PPO

## 2019-07-10 DIAGNOSIS — E538 Deficiency of other specified B group vitamins: Secondary | ICD-10-CM | POA: Diagnosis not present

## 2019-07-10 MED ORDER — CYANOCOBALAMIN 1000 MCG/ML IJ SOLN
1000.0000 ug | Freq: Once | INTRAMUSCULAR | Status: AC
  Administered 2019-07-10: 1000 ug via INTRAMUSCULAR

## 2019-07-10 NOTE — Patient Instructions (Signed)
There are no preventive care reminders to display for this patient.  Depression screen [facility] 2/9 06/17/2019 10/23/2017 07/05/2016  Decreased Interest 0 0 0  Down, Depressed, Hopeless 0 0 0  PHQ - 2 Score 0 0 0  Altered sleeping 0 0 -  Tired, decreased energy 1 0 -  Change in appetite 0 0 -  Feeling bad or failure about yourself  0 0 -  Trouble concentrating 0 0 -  Moving slowly or fidgety/restless 0 0 -  Suicidal thoughts 0 0 -  PHQ-9 Score 1 0 -  Difficult doing work/chores Not difficult at all - -

## 2019-07-10 NOTE — Progress Notes (Signed)
Per orders of Dr.Hernandez, injection of B12 given in Right deltoid by Franco Collet. Patient tolerated injection well.

## 2019-07-15 ENCOUNTER — Other Ambulatory Visit: Payer: Self-pay

## 2019-07-16 ENCOUNTER — Ambulatory Visit (INDEPENDENT_AMBULATORY_CARE_PROVIDER_SITE_OTHER): Payer: BC Managed Care – PPO | Admitting: *Deleted

## 2019-07-16 DIAGNOSIS — E538 Deficiency of other specified B group vitamins: Secondary | ICD-10-CM

## 2019-07-16 MED ORDER — CYANOCOBALAMIN 1000 MCG/ML IJ SOLN
1000.0000 ug | Freq: Once | INTRAMUSCULAR | Status: AC
  Administered 2019-07-16: 1000 ug via INTRAMUSCULAR

## 2019-07-16 NOTE — Progress Notes (Signed)
Per orders of [employer], injection of B12 given by Westley Hummer. Patient tolerated injection well.

## 2019-07-21 DIAGNOSIS — M545 Low back pain: Secondary | ICD-10-CM | POA: Diagnosis not present

## 2019-07-21 DIAGNOSIS — M62830 Muscle spasm of back: Secondary | ICD-10-CM | POA: Diagnosis not present

## 2019-07-21 DIAGNOSIS — M9903 Segmental and somatic dysfunction of lumbar region: Secondary | ICD-10-CM | POA: Diagnosis not present

## 2019-07-21 DIAGNOSIS — M5431 Sciatica, right side: Secondary | ICD-10-CM | POA: Diagnosis not present

## 2019-07-29 DIAGNOSIS — M545 Low back pain: Secondary | ICD-10-CM | POA: Diagnosis not present

## 2019-07-29 DIAGNOSIS — M9903 Segmental and somatic dysfunction of lumbar region: Secondary | ICD-10-CM | POA: Diagnosis not present

## 2019-07-29 DIAGNOSIS — M5431 Sciatica, right side: Secondary | ICD-10-CM | POA: Diagnosis not present

## 2019-07-29 DIAGNOSIS — M62830 Muscle spasm of back: Secondary | ICD-10-CM | POA: Diagnosis not present

## 2019-07-30 DIAGNOSIS — M9903 Segmental and somatic dysfunction of lumbar region: Secondary | ICD-10-CM | POA: Diagnosis not present

## 2019-07-30 DIAGNOSIS — M5431 Sciatica, right side: Secondary | ICD-10-CM | POA: Diagnosis not present

## 2019-07-30 DIAGNOSIS — M545 Low back pain: Secondary | ICD-10-CM | POA: Diagnosis not present

## 2019-07-30 DIAGNOSIS — M62830 Muscle spasm of back: Secondary | ICD-10-CM | POA: Diagnosis not present

## 2019-08-03 DIAGNOSIS — M545 Low back pain: Secondary | ICD-10-CM | POA: Diagnosis not present

## 2019-08-03 DIAGNOSIS — M5431 Sciatica, right side: Secondary | ICD-10-CM | POA: Diagnosis not present

## 2019-08-03 DIAGNOSIS — M62830 Muscle spasm of back: Secondary | ICD-10-CM | POA: Diagnosis not present

## 2019-08-03 DIAGNOSIS — M9903 Segmental and somatic dysfunction of lumbar region: Secondary | ICD-10-CM | POA: Diagnosis not present

## 2019-08-04 DIAGNOSIS — M545 Low back pain: Secondary | ICD-10-CM | POA: Diagnosis not present

## 2019-08-04 DIAGNOSIS — M9903 Segmental and somatic dysfunction of lumbar region: Secondary | ICD-10-CM | POA: Diagnosis not present

## 2019-08-04 DIAGNOSIS — M62830 Muscle spasm of back: Secondary | ICD-10-CM | POA: Diagnosis not present

## 2019-08-04 DIAGNOSIS — M5431 Sciatica, right side: Secondary | ICD-10-CM | POA: Diagnosis not present

## 2019-08-12 DIAGNOSIS — M545 Low back pain: Secondary | ICD-10-CM | POA: Diagnosis not present

## 2019-08-12 DIAGNOSIS — M62830 Muscle spasm of back: Secondary | ICD-10-CM | POA: Diagnosis not present

## 2019-08-12 DIAGNOSIS — M5431 Sciatica, right side: Secondary | ICD-10-CM | POA: Diagnosis not present

## 2019-08-12 DIAGNOSIS — M9903 Segmental and somatic dysfunction of lumbar region: Secondary | ICD-10-CM | POA: Diagnosis not present

## 2019-08-13 DIAGNOSIS — M9903 Segmental and somatic dysfunction of lumbar region: Secondary | ICD-10-CM | POA: Diagnosis not present

## 2019-08-13 DIAGNOSIS — M62830 Muscle spasm of back: Secondary | ICD-10-CM | POA: Diagnosis not present

## 2019-08-13 DIAGNOSIS — M545 Low back pain: Secondary | ICD-10-CM | POA: Diagnosis not present

## 2019-08-13 DIAGNOSIS — M5431 Sciatica, right side: Secondary | ICD-10-CM | POA: Diagnosis not present

## 2019-08-17 ENCOUNTER — Ambulatory Visit (INDEPENDENT_AMBULATORY_CARE_PROVIDER_SITE_OTHER): Payer: BC Managed Care – PPO | Admitting: *Deleted

## 2019-08-17 ENCOUNTER — Other Ambulatory Visit: Payer: Self-pay

## 2019-08-17 DIAGNOSIS — E538 Deficiency of other specified B group vitamins: Secondary | ICD-10-CM

## 2019-08-17 MED ORDER — CYANOCOBALAMIN 1000 MCG/ML IJ SOLN
1000.0000 ug | Freq: Once | INTRAMUSCULAR | Status: AC
Start: 2019-08-17 — End: 2019-08-17
  Administered 2019-08-17: 1000 ug via INTRAMUSCULAR

## 2019-08-17 NOTE — Progress Notes (Signed)
Patient in office today for B-12 injection. Injection administered with no immediate reaction 

## 2019-08-31 NOTE — Addendum Note (Signed)
Addended by: Marrion Coy on: 08/31/2019 03:56 PM   Modules accepted: Orders

## 2019-09-17 ENCOUNTER — Ambulatory Visit: Payer: BC Managed Care – PPO

## 2019-09-18 ENCOUNTER — Other Ambulatory Visit: Payer: Self-pay

## 2019-09-18 ENCOUNTER — Ambulatory Visit (INDEPENDENT_AMBULATORY_CARE_PROVIDER_SITE_OTHER): Payer: BC Managed Care – PPO | Admitting: *Deleted

## 2019-09-18 DIAGNOSIS — E538 Deficiency of other specified B group vitamins: Secondary | ICD-10-CM

## 2019-09-18 MED ORDER — CYANOCOBALAMIN 1000 MCG/ML IJ SOLN
1000.0000 ug | Freq: Once | INTRAMUSCULAR | Status: AC
Start: 2019-09-18 — End: 2019-09-18
  Administered 2019-09-18: 1000 ug via INTRAMUSCULAR

## 2019-09-18 NOTE — Progress Notes (Signed)
Per orders of Dr. Hernandez, injection of B12 given by Samatha Anspach. Patient tolerated injection well.  

## 2019-09-25 ENCOUNTER — Other Ambulatory Visit: Payer: Self-pay

## 2019-09-28 ENCOUNTER — Other Ambulatory Visit: Payer: Self-pay

## 2019-12-18 ENCOUNTER — Ambulatory Visit: Payer: Self-pay | Admitting: Internal Medicine

## 2020-01-04 DIAGNOSIS — L280 Lichen simplex chronicus: Secondary | ICD-10-CM | POA: Diagnosis not present

## 2020-01-05 ENCOUNTER — Ambulatory Visit: Payer: BC Managed Care – PPO | Admitting: Internal Medicine

## 2020-01-08 ENCOUNTER — Ambulatory Visit: Payer: BC Managed Care – PPO | Admitting: Internal Medicine

## 2020-01-27 ENCOUNTER — Other Ambulatory Visit: Payer: Self-pay

## 2020-01-27 ENCOUNTER — Encounter: Payer: Self-pay | Admitting: Internal Medicine

## 2020-01-27 ENCOUNTER — Ambulatory Visit: Payer: BC Managed Care – PPO | Admitting: Internal Medicine

## 2020-01-27 VITALS — BP 100/68 | HR 81 | Temp 98.5°F | Wt 195.3 lb

## 2020-01-27 DIAGNOSIS — R7302 Impaired glucose tolerance (oral): Secondary | ICD-10-CM

## 2020-01-27 DIAGNOSIS — M545 Low back pain, unspecified: Secondary | ICD-10-CM

## 2020-01-27 DIAGNOSIS — E559 Vitamin D deficiency, unspecified: Secondary | ICD-10-CM

## 2020-01-27 DIAGNOSIS — E538 Deficiency of other specified B group vitamins: Secondary | ICD-10-CM | POA: Diagnosis not present

## 2020-01-27 DIAGNOSIS — E781 Pure hyperglyceridemia: Secondary | ICD-10-CM | POA: Diagnosis not present

## 2020-01-27 LAB — POCT GLYCOSYLATED HEMOGLOBIN (HGB A1C): Hemoglobin A1C: 6.3 % — AB (ref 4.0–5.6)

## 2020-01-27 MED ORDER — CYANOCOBALAMIN 1000 MCG/ML IJ SOLN
1000.0000 ug | Freq: Once | INTRAMUSCULAR | Status: AC
Start: 2020-01-27 — End: 2020-01-27
  Administered 2020-01-27: 1000 ug via INTRAMUSCULAR

## 2020-01-27 NOTE — Progress Notes (Signed)
Established Patient Office Visit     This visit occurred during the SARS-CoV-2 public health emergency.  Safety protocols were in place, including screening questions prior to the visit, additional usage of staff PPE, and extensive cleaning of exam room while observing appropriate contact time as indicated for disinfecting solutions.    CC/Reason for Visit: Follow-up chronic medical conditions  HPI: Rodney Burgess is a 59 y.o. male who is coming in today for the above mentioned reasons.  After his last visit in May he was diagnosed with impaired glucose tolerance, hypertriglyceridemia as well as vitamin D and B12 deficiency.  He completed his 12 weeks of vitamin D high-dose supplementation, his last B12 injection was in July.  He believes he has made some positive changes to his diet and has lost around 2 pounds since his last visit.  He has no acute complaints today.  He got his Covid booster 2 days ago.  He continues to complain of right-sided lower back pain without sciatica that he believes is urinary in nature as he also has foul-smelling urine.  His urine was negative in May.  He had the same symptoms then.  He visited a chiropractor without significant relief.   Past Medical/Surgical History: Past Medical History:  Diagnosis Date  . Acute bronchitis   . Contact dermatitis and other eczema due to plants (except food)   . Internal hemorrhoids   . Localized superficial swelling, mass, or lump   . Pain in joint, shoulder region   . Personal history of colonic polyp - right sided hyperplastic (serrated) 07/09/2013  . Prolapsed internal hemorrhoids 09/20/2018   Gr 2 internals and some external components  . Syncope and collapse     Past Surgical History:  Procedure Laterality Date  . COLONOSCOPY    . HEMORRHOID BANDING  08/2018  . UMBILICAL HERNIA REPAIR      Social History:  reports that he has quit smoking. He has never used smokeless tobacco. He reports current alcohol  use. He reports that he does not use drugs.  Allergies: No Known Allergies  Family History:  Family History  Problem Relation Age of Onset  . Colon cancer Neg Hx   . Esophageal cancer Neg Hx   . Rectal cancer Neg Hx   . Stomach cancer Neg Hx     No current outpatient medications on file.  Review of Systems:  Constitutional: Denies fever, chills, diaphoresis, appetite change and fatigue.  HEENT: Denies photophobia, eye pain, redness, hearing loss, ear pain, congestion, sore throat, rhinorrhea, sneezing, mouth sores, trouble swallowing, neck pain, neck stiffness and tinnitus.   Respiratory: Denies SOB, DOE, cough, chest tightness,  and wheezing.   Cardiovascular: Denies chest pain, palpitations and leg swelling.  Gastrointestinal: Denies nausea, vomiting, abdominal pain, diarrhea, constipation, blood in stool and abdominal distention.  Genitourinary: Denies dysuria, urgency, frequency, hematuria, flank pain and difficulty urinating.  Endocrine: Denies: hot or cold intolerance, sweats, changes in hair or nails, polyuria, polydipsia. Musculoskeletal: Denies myalgias, back pain, joint swelling, arthralgias and gait problem.  Skin: Denies pallor, rash and wound.  Neurological: Denies dizziness, seizures, syncope, weakness, light-headedness, numbness and headaches.  Hematological: Denies adenopathy. Easy bruising, personal or family bleeding history  Psychiatric/Behavioral: Denies suicidal ideation, mood changes, confusion, nervousness, sleep disturbance and agitation    Physical Exam: Vitals:   01/27/20 0901  BP: 100/68  Pulse: 81  Temp: 98.5 F (36.9 C)  TempSrc: Oral  SpO2: 93%  Weight: 195 lb 4.8 oz (88.6  kg)    Body mass index is 32.5 kg/m.   Constitutional: NAD, calm, comfortable Eyes: PERRL, lids and conjunctivae normal ENMT: Mucous membranes are moist.  Respiratory: clear to auscultation bilaterally, no wheezing, no crackles. Normal respiratory effort. No accessory  muscle use.  Cardiovascular: Regular rate and rhythm, no murmurs / rubs / gallops. No extremity edema. Neurologic: Grossly intact and nonfocal. Psychiatric: Normal judgment and insight. Alert and oriented x 3. Normal mood.    Impression and Plan:  IGT (impaired glucose tolerance)  -A1c remains in the prediabetic range at 6.3 today. -Have encouraged more aggressive lifestyle modifications.  Vitamin D deficiency -Check vitamin D levels today.  Vitamin B12 deficiency -Check B12 levels, B12 injection today.  Hypertriglyceridemia -Recheck fasting lipids today.  Acute right-sided low back pain without sciatica -UA and culture today per request, do not believe this is urinary in nature. -Suspect this is muscular related to bad posture.  Have advised PT but he would like to hold off for urine results first.   Patient Instructions  -Nice seeing you today!!  -Lab work today; will notify you once results are available.  -Schedule follow up in 6 months for your physical. Please come in fasting that day.  -B12 injection today.     Chaya Jan, MD  Primary [facility] at [facility]

## 2020-01-27 NOTE — Addendum Note (Signed)
Addended by: Lerry Liner on: 01/27/2020 09:35 AM   Modules accepted: Orders

## 2020-01-27 NOTE — Addendum Note (Signed)
Addended by: Kern Reap B on: 01/27/2020 09:30 AM   Modules accepted: Orders

## 2020-01-27 NOTE — Patient Instructions (Signed)
-  Nice seeing you today!!  -Lab work today; will notify you once results are available.  -Schedule follow up in 6 months for your physical. Please come in fasting that day.  -B12 injection today.

## 2020-01-28 ENCOUNTER — Other Ambulatory Visit: Payer: Self-pay | Admitting: Internal Medicine

## 2020-01-28 DIAGNOSIS — E559 Vitamin D deficiency, unspecified: Secondary | ICD-10-CM

## 2020-01-28 LAB — URINE CULTURE
MICRO NUMBER:: 11366372
Result:: NO GROWTH
SPECIMEN QUALITY:: ADEQUATE

## 2020-01-28 LAB — VITAMIN D 25 HYDROXY (VIT D DEFICIENCY, FRACTURES): Vit D, 25-Hydroxy: 25 ng/mL — ABNORMAL LOW (ref 30–100)

## 2020-01-28 MED ORDER — VITAMIN D (ERGOCALCIFEROL) 1.25 MG (50000 UNIT) PO CAPS: 50000.0000 [IU] | ORAL_CAPSULE | ORAL | 0 refills | Status: AC

## 2020-02-11 DIAGNOSIS — Z20822 Contact with and (suspected) exposure to covid-19: Secondary | ICD-10-CM | POA: Diagnosis not present

## 2020-02-11 DIAGNOSIS — U071 COVID-19: Secondary | ICD-10-CM | POA: Diagnosis not present

## 2020-02-19 ENCOUNTER — Telehealth: Payer: Self-pay | Admitting: Internal Medicine

## 2020-02-19 NOTE — Telephone Encounter (Signed)
Pt call and want and want a call back to talk about something for covid .

## 2020-02-19 NOTE — Telephone Encounter (Signed)
Sorry to hear that. Would advise an OTC cold/flu preparation such as tylenol cold and flu or dayquil/nyquil. Please have him contact us if symptoms worsen.

## 2020-02-19 NOTE — Telephone Encounter (Signed)
Patient is aware 

## 2020-02-19 NOTE — Telephone Encounter (Signed)
Patient was tested yesterday and is positive for Covid.  Patient has headache, sore throat, and cough.  Please advise.

## 2020-02-24 DIAGNOSIS — Z20822 Contact with and (suspected) exposure to covid-19: Secondary | ICD-10-CM | POA: Diagnosis not present

## 2020-06-01 DIAGNOSIS — Z20822 Contact with and (suspected) exposure to covid-19: Secondary | ICD-10-CM | POA: Diagnosis not present

## 2020-06-17 ENCOUNTER — Encounter: Payer: BC Managed Care – PPO | Admitting: Internal Medicine

## 2020-09-07 DIAGNOSIS — S0992XA Unspecified injury of nose, initial encounter: Secondary | ICD-10-CM | POA: Diagnosis not present

## 2020-09-07 DIAGNOSIS — J342 Deviated nasal septum: Secondary | ICD-10-CM | POA: Diagnosis not present

## 2020-09-07 DIAGNOSIS — J3489 Other specified disorders of nose and nasal sinuses: Secondary | ICD-10-CM | POA: Diagnosis not present

## 2021-01-03 DIAGNOSIS — M170 Bilateral primary osteoarthritis of knee: Secondary | ICD-10-CM | POA: Diagnosis not present

## 2021-04-12 ENCOUNTER — Encounter: Payer: BC Managed Care – PPO | Admitting: Internal Medicine

## 2021-04-25 ENCOUNTER — Ambulatory Visit (INDEPENDENT_AMBULATORY_CARE_PROVIDER_SITE_OTHER): Payer: BC Managed Care – PPO | Admitting: Internal Medicine

## 2021-04-25 ENCOUNTER — Encounter: Payer: Self-pay | Admitting: Internal Medicine

## 2021-04-25 VITALS — BP 102/58 | HR 78 | Temp 98.1°F | Ht 64.5 in | Wt 196.7 lb

## 2021-04-25 DIAGNOSIS — E538 Deficiency of other specified B group vitamins: Secondary | ICD-10-CM

## 2021-04-25 DIAGNOSIS — E559 Vitamin D deficiency, unspecified: Secondary | ICD-10-CM | POA: Diagnosis not present

## 2021-04-25 DIAGNOSIS — Z0000 Encounter for general adult medical examination without abnormal findings: Secondary | ICD-10-CM

## 2021-04-25 DIAGNOSIS — R7302 Impaired glucose tolerance (oral): Secondary | ICD-10-CM | POA: Diagnosis not present

## 2021-04-25 DIAGNOSIS — E781 Pure hyperglyceridemia: Secondary | ICD-10-CM

## 2021-04-25 NOTE — Progress Notes (Signed)
? ? ? ?Established Patient Office Visit ? ? ? ? ?This visit occurred during the SARS-CoV-2 public health emergency.  Safety protocols were in place, including screening questions prior to the visit, additional usage of staff PPE, and extensive cleaning of exam room while observing appropriate contact time as indicated for disinfecting solutions.  ? ? ?CC/Reason for Visit: Annual preventive exam ? ?HPI: Rodney Burgess is a 61 y.o. male who is coming in today for the above mentioned reasons. Past Medical History is significant for: Impaired glucose tolerance, hypertriglyceridemia, vitamin D and B12 deficiency.  He has been feeling well and has no acute concerns or complaints.  He has routine eye and dental care.  He is due for COVID booster.  He had a colonoscopy in 2020. ? ? ?Past Medical/Surgical History: ?Past Medical History:  ?Diagnosis Date  ? Acute bronchitis   ? Contact dermatitis and other eczema due to plants (except food)   ? Internal hemorrhoids   ? Localized superficial swelling, mass, or lump   ? Pain in joint, shoulder region   ? Personal history of colonic polyp - right sided hyperplastic (serrated) 07/09/2013  ? Prolapsed internal hemorrhoids 09/20/2018  ? Gr 2 internals and some external components  ? Syncope and collapse   ? ? ?Past Surgical History:  ?Procedure Laterality Date  ? COLONOSCOPY    ? HEMORRHOID BANDING  08/2018  ? UMBILICAL HERNIA REPAIR    ? ? ?Social History: ? reports that he has quit smoking. He has never used smokeless tobacco. He reports current alcohol use. He reports that he does not use drugs. ? ?Allergies: ?No Known Allergies ? ?Family History:  ?Family History  ?Problem Relation Age of Onset  ? Colon cancer Neg Hx   ? Esophageal cancer Neg Hx   ? Rectal cancer Neg Hx   ? Stomach cancer Neg Hx   ? ? ?No current outpatient medications on file. ? ?Review of Systems:  ?Constitutional: Denies fever, chills, diaphoresis, appetite change and fatigue.  ?HEENT: Denies photophobia,  eye pain, redness, hearing loss, ear pain, congestion, sore throat, rhinorrhea, sneezing, mouth sores, trouble swallowing, neck pain, neck stiffness and tinnitus.   ?Respiratory: Denies SOB, DOE, cough, chest tightness,  and wheezing.   ?Cardiovascular: Denies chest pain, palpitations and leg swelling.  ?Gastrointestinal: Denies nausea, vomiting, abdominal pain, diarrhea, constipation, blood in stool and abdominal distention.  ?Genitourinary: Denies dysuria, urgency, frequency, hematuria, flank pain and difficulty urinating.  ?Endocrine: Denies: hot or cold intolerance, sweats, changes in hair or nails, polyuria, polydipsia. ?Musculoskeletal: Denies myalgias, back pain, joint swelling, arthralgias and gait problem.  ?Skin: Denies pallor, rash and wound.  ?Neurological: Denies dizziness, seizures, syncope, weakness, light-headedness, numbness and headaches.  ?Hematological: Denies adenopathy. Easy bruising, personal or family bleeding history  ?Psychiatric/Behavioral: Denies suicidal ideation, mood changes, confusion, nervousness, sleep disturbance and agitation ? ? ? ?Physical Exam: ?Vitals:  ? 04/25/21 1409  ?BP: (!) 102/58  ?Pulse: 78  ?Temp: 98.1 ?F (36.7 ?C)  ?TempSrc: Oral  ?SpO2: 95%  ?Weight: 196 lb 11.2 oz (89.2 kg)  ?Height: 5' 4.5" (1.638 m)  ? ? ?Body mass index is 33.24 kg/m?. ? ? ?Constitutional: NAD, calm, comfortable ?Eyes: PERRL, lids and conjunctivae normal ?ENMT: Mucous membranes are moist. Posterior pharynx clear of any exudate or lesions. Normal dentition. Tympanic membrane is pearly white, no erythema or bulging. ?Neck: normal, supple, no masses, no thyromegaly ?Respiratory: clear to auscultation bilaterally, no wheezing, no crackles. Normal respiratory effort. No accessory muscle use.  ?  Cardiovascular: Regular rate and rhythm, no murmurs / rubs / gallops. No extremity edema. 2+ pedal pulses. No carotid bruits.  ?Abdomen: no tenderness, no masses palpated. No hepatosplenomegaly. Bowel sounds  positive.  ?Musculoskeletal: no clubbing / cyanosis. No joint deformity upper and lower extremities. Good ROM, no contractures. Normal muscle tone.  ?Skin: no rashes, lesions, ulcers. No induration ?Neurologic: CN 2-12 grossly intact. Sensation intact, DTR normal. Strength 5/5 in all 4.  ?Psychiatric: Normal judgment and insight. Alert and oriented x 3. Normal mood.  ? ? ?Impression and Plan: ? ?Encounter for preventive health examination ?-Recommend routine eye and dental care. ?-Immunizations: Advised COVID booster at pharmacy, otherwise immunizations are up-to-date ?-Healthy lifestyle discussed in detail. ?-Labs to be updated today. ?-Colon cancer screening: 2020, 10-year callback ?-Breast cancer screening: Not applicable ?-Cervical cancer screening: Not applicable ?-Lung cancer screening: Never smoker ?-Prostate cancer screening: PSA 0.6 in 0630 ?-DEXA: Not applicable ? ?Vitamin D deficiency  ?- Plan: VITAMIN D 25 Hydroxy (Vit-D Deficiency, Fractures) ? ?Vitamin B12 deficiency ? - Plan: Vitamin B12 ? ?IGT (impaired glucose tolerance)  ?- Plan: Hemoglobin A1c ? ?Hypertriglyceridemia  ?- Plan: CBC with Differential/Platelet, Comprehensive metabolic panel, Lipid panel ? ? ? ?Patient Instructions  ?-Nice seeing you today!! ? ?-Return fasting for labs. ? ?-Remember your COVID booster. ? ?-Schedule follow up in 1 year or sooner as needed. ? ? ? ? ? ?Lelon Frohlich, MD ?[facility] at [facility] ? ? ?

## 2021-04-25 NOTE — Patient Instructions (Signed)
-  Nice seeing you today!! ? ?-Return fasting for labs. ? ?-Remember your COVID booster. ? ?-Schedule follow up in 1 year or sooner as needed. ? ? ?

## 2021-04-28 ENCOUNTER — Other Ambulatory Visit (INDEPENDENT_AMBULATORY_CARE_PROVIDER_SITE_OTHER): Payer: BC Managed Care – PPO

## 2021-04-28 DIAGNOSIS — E538 Deficiency of other specified B group vitamins: Secondary | ICD-10-CM

## 2021-04-28 DIAGNOSIS — E559 Vitamin D deficiency, unspecified: Secondary | ICD-10-CM

## 2021-04-28 DIAGNOSIS — R7302 Impaired glucose tolerance (oral): Secondary | ICD-10-CM

## 2021-04-28 DIAGNOSIS — E781 Pure hyperglyceridemia: Secondary | ICD-10-CM | POA: Diagnosis not present

## 2021-04-28 LAB — LIPID PANEL
Cholesterol: 182 mg/dL (ref 0–200)
HDL: 33.8 mg/dL — ABNORMAL LOW (ref >39.00)
LDL Cholesterol: 116 mg/dL — ABNORMAL HIGH (ref 0–99)
NonHDL: 148.01
Total CHOL/HDL Ratio: 5
Triglycerides: 160 mg/dL — ABNORMAL HIGH (ref 0.0–149.0)
VLDL: 32 mg/dL (ref 0.0–40.0)

## 2021-04-28 LAB — CBC WITH DIFFERENTIAL/PLATELET
Basophils Absolute: 0.1 10*3/uL (ref 0.0–0.1)
Basophils Relative: 0.7 % (ref 0.0–3.0)
Eosinophils Absolute: 0.3 10*3/uL (ref 0.0–0.7)
Eosinophils Relative: 3.3 % (ref 0.0–5.0)
HCT: 47.2 % (ref 39.0–52.0)
Hemoglobin: 15.8 g/dL (ref 13.0–17.0)
Lymphocytes Relative: 37.3 % (ref 12.0–46.0)
Lymphs Abs: 3.4 10*3/uL (ref 0.7–4.0)
MCHC: 33.4 g/dL (ref 30.0–36.0)
MCV: 87.7 fl (ref 78.0–100.0)
Monocytes Absolute: 1 10*3/uL (ref 0.1–1.0)
Monocytes Relative: 10.4 % (ref 3.0–12.0)
Neutro Abs: 4.4 10*3/uL (ref 1.4–7.7)
Neutrophils Relative %: 48.3 % (ref 43.0–77.0)
Platelets: 231 10*3/uL (ref 150.0–400.0)
RBC: 5.38 Mil/uL (ref 4.22–5.81)
RDW: 14 % (ref 11.5–15.5)
WBC: 9.2 10*3/uL (ref 4.0–10.5)

## 2021-04-28 LAB — COMPREHENSIVE METABOLIC PANEL
ALT: 33 U/L (ref 0–53)
AST: 20 U/L (ref 0–37)
Albumin: 4 g/dL (ref 3.5–5.2)
Alkaline Phosphatase: 77 U/L (ref 39–117)
BUN: 14 mg/dL (ref 6–23)
CO2: 29 mEq/L (ref 19–32)
Calcium: 9.6 mg/dL (ref 8.4–10.5)
Chloride: 105 mEq/L (ref 96–112)
Creatinine, Ser: 0.89 mg/dL (ref 0.40–1.50)
GFR: 94.13 mL/min (ref >60.00)
Glucose, Bld: 100 mg/dL — ABNORMAL HIGH (ref 70–99)
Potassium: 4.2 mEq/L (ref 3.5–5.1)
Sodium: 139 mEq/L (ref 135–145)
Total Bilirubin: 0.5 mg/dL (ref 0.2–1.2)
Total Protein: 7 g/dL (ref 6.0–8.3)

## 2021-04-28 LAB — VITAMIN B12: Vitamin B-12: 266 pg/mL (ref 211–911)

## 2021-04-28 LAB — VITAMIN D 25 HYDROXY (VIT D DEFICIENCY, FRACTURES): VITD: 19.59 ng/mL — ABNORMAL LOW (ref 30.00–100.00)

## 2021-04-28 LAB — HEMOGLOBIN A1C: Hgb A1c MFr Bld: 6.5 % (ref 4.6–6.5)

## 2021-05-01 ENCOUNTER — Encounter: Payer: Self-pay | Admitting: Internal Medicine

## 2021-05-01 ENCOUNTER — Other Ambulatory Visit: Payer: Self-pay | Admitting: Internal Medicine

## 2021-05-01 DIAGNOSIS — E559 Vitamin D deficiency, unspecified: Secondary | ICD-10-CM

## 2021-05-01 DIAGNOSIS — E1169 Type 2 diabetes mellitus with other specified complication: Secondary | ICD-10-CM

## 2021-05-01 MED ORDER — ATORVASTATIN CALCIUM 20 MG PO TABS: 20.0000 mg | ORAL_TABLET | Freq: Every day | ORAL | 1 refills | Status: DC

## 2021-05-01 MED ORDER — VITAMIN D (ERGOCALCIFEROL) 1.25 MG (50000 UNIT) PO CAPS: 50000.0000 [IU] | ORAL_CAPSULE | ORAL | 0 refills | Status: AC

## 2021-07-31 ENCOUNTER — Encounter: Payer: Self-pay | Admitting: Internal Medicine

## 2021-07-31 ENCOUNTER — Ambulatory Visit: Payer: BC Managed Care – PPO | Admitting: Internal Medicine

## 2021-07-31 VITALS — BP 110/78 | HR 82 | Temp 97.8°F | Wt 196.2 lb

## 2021-07-31 DIAGNOSIS — E1169 Type 2 diabetes mellitus with other specified complication: Secondary | ICD-10-CM | POA: Diagnosis not present

## 2021-07-31 DIAGNOSIS — E785 Hyperlipidemia, unspecified: Secondary | ICD-10-CM

## 2021-07-31 DIAGNOSIS — E559 Vitamin D deficiency, unspecified: Secondary | ICD-10-CM | POA: Diagnosis not present

## 2021-07-31 DIAGNOSIS — E119 Type 2 diabetes mellitus without complications: Secondary | ICD-10-CM | POA: Diagnosis not present

## 2021-07-31 DIAGNOSIS — E538 Deficiency of other specified B group vitamins: Secondary | ICD-10-CM

## 2021-07-31 LAB — MICROALBUMIN / CREATININE URINE RATIO
Creatinine,U: 113.5 mg/dL
Microalb Creat Ratio: 0.6 mg/g (ref 0.0–30.0)
Microalb, Ur: <0.7 mg/dL (ref 0.0–1.9)

## 2021-07-31 LAB — POCT GLYCOSYLATED HEMOGLOBIN (HGB A1C): Hemoglobin A1C: 6.8 % — AB (ref 4.0–5.6)

## 2021-07-31 LAB — VITAMIN D 25 HYDROXY (VIT D DEFICIENCY, FRACTURES): VITD: 22.37 ng/mL — ABNORMAL LOW (ref 30.00–100.00)

## 2021-07-31 NOTE — Progress Notes (Signed)
Established Patient Office Visit     CC/Reason for Visit: 53-monthfollow-up chronic conditions  HPI: SYandel Zeineris a 61y.o. male who is coming in today for the above mentioned reasons. Past Medical History is significant for: Hyperlipidemia, vitamin D and B12 deficiencies manually diagnosed type 2 diabetes.  He admits to some recent significant dietary indiscretions due to his traveling.  He is hesitant to start medications for diabetes.  He has also not been taking his atorvastatin.  He otherwise feels well.   Past Medical/Surgical History: Past Medical History:  Diagnosis Date   Acute bronchitis    Contact dermatitis and other eczema due to plants (except food)    Internal hemorrhoids    Localized superficial swelling, mass, or lump    Pain in joint, shoulder region    Personal history of colonic polyp - right sided hyperplastic (serrated) 07/09/2013   Prolapsed internal hemorrhoids 09/20/2018   Gr 2 internals and some external components   Syncope and collapse     Past Surgical History:  Procedure Laterality Date   COLONOSCOPY     HEMORRHOID BANDING  [id number]  UMBILICAL HERNIA REPAIR      Social History:  reports that he has quit smoking. He has never used smokeless tobacco. He reports current alcohol use. He reports that he does not use drugs.  Allergies: No Known Allergies  Family History:  Family History  Problem Relation Age of Onset   Colon cancer Neg Hx    Esophageal cancer Neg Hx    Rectal cancer Neg Hx    Stomach cancer Neg Hx      Current Outpatient Medications:    atorvastatin (LIPITOR) 20 MG tablet, Take 1 tablet (20 mg total) by mouth daily. (Patient not taking: Reported on 07/31/2021), Disp: 90 tablet, Rfl: 1  Review of Systems:  Constitutional: Denies fever, chills, diaphoresis, appetite change and fatigue.  HEENT: Denies photophobia, eye pain, redness, hearing loss, ear pain, congestion, sore throat, rhinorrhea, sneezing, mouth sores,  trouble swallowing, neck pain, neck stiffness and tinnitus.   Respiratory: Denies SOB, DOE, cough, chest tightness,  and wheezing.   Cardiovascular: Denies chest pain, palpitations and leg swelling.  Gastrointestinal: Denies nausea, vomiting, abdominal pain, diarrhea, constipation, blood in stool and abdominal distention.  Genitourinary: Denies dysuria, urgency, frequency, hematuria, flank pain and difficulty urinating.  Endocrine: Denies: hot or cold intolerance, sweats, changes in hair or nails, polyuria, polydipsia. Musculoskeletal: Denies myalgias, back pain, joint swelling, arthralgias and gait problem.  Skin: Denies pallor, rash and wound.  Neurological: Denies dizziness, seizures, syncope, weakness, light-headedness, numbness and headaches.  Hematological: Denies adenopathy. Easy bruising, personal or family bleeding history  Psychiatric/Behavioral: Denies suicidal ideation, mood changes, confusion, nervousness, sleep disturbance and agitation    Physical Exam: Vitals:   07/31/21 0802  BP: 110/78  Pulse: 82  Temp: 97.8 F (36.6 C)  TempSrc: Oral  SpO2: 96%  Weight: 196 lb 3.2 oz (89 kg)    Body mass index is 33.16 kg/m.   Constitutional: NAD, calm, comfortable Eyes: PERRL, lids and conjunctivae normal ENMT: Mucous membranes are moist.  Respiratory: clear to auscultation bilaterally, no wheezing, no crackles. Normal respiratory effort. No accessory muscle use.  Cardiovascular: Regular rate and rhythm, no murmurs / rubs / gallops. No extremity edema.  Psychiatric: Normal judgment and insight. Alert and oriented x 3. Normal mood.    Impression and Plan:  Type 2 diabetes mellitus without complication, without long-term current use of insulin ([place]  -  Plan: POCT glycosylated hemoglobin (Hb A1C), Urine microalbumin-creatinine with uACR -A1c has increased from 6.5 upon diagnosis to 6.8.  He prefers to continue to attempt to control with lifestyle changes, follow-up in 3  months. -Check microalbumin. -Diabetic eye exam will be scheduled.  Vitamin D deficiency  - Plan: VITAMIN D 25 Hydroxy (Vit-D Deficiency, Fractures), CANCELED: VITAMIN D 25 Hydroxy (Vit-D Deficiency, Fractures)  Hyperlipidemia associated with type 2 diabetes mellitus ([place]) -He will resume atorvastatin 20 mg, recheck lipids in 3 months.  Vitamin B12 deficiency -Recheck levels with next visit.    Time spent:32 minutes reviewing chart, interviewing and examining patient and formulating plan of care.     Lelon Frohlich, MD [facility] at [facility]

## 2021-08-02 ENCOUNTER — Other Ambulatory Visit: Payer: Self-pay | Admitting: Internal Medicine

## 2021-08-02 DIAGNOSIS — E559 Vitamin D deficiency, unspecified: Secondary | ICD-10-CM

## 2021-08-02 MED ORDER — VITAMIN D (ERGOCALCIFEROL) 1.25 MG (50000 UNIT) PO CAPS: 50000.0000 [IU] | ORAL_CAPSULE | ORAL | 0 refills | Status: AC

## 2021-08-03 NOTE — Addendum Note (Signed)
Addended by: Agnes Lawrence on: 08/03/2021 10:03 AM   Modules accepted: Orders

## 2021-08-09 DIAGNOSIS — M5412 Radiculopathy, cervical region: Secondary | ICD-10-CM | POA: Diagnosis not present

## 2021-08-09 DIAGNOSIS — M9901 Segmental and somatic dysfunction of cervical region: Secondary | ICD-10-CM | POA: Diagnosis not present

## 2021-08-09 DIAGNOSIS — M25512 Pain in left shoulder: Secondary | ICD-10-CM | POA: Diagnosis not present

## 2021-08-09 DIAGNOSIS — M9903 Segmental and somatic dysfunction of lumbar region: Secondary | ICD-10-CM | POA: Diagnosis not present

## 2021-08-10 DIAGNOSIS — M25512 Pain in left shoulder: Secondary | ICD-10-CM | POA: Diagnosis not present

## 2021-08-10 DIAGNOSIS — M9903 Segmental and somatic dysfunction of lumbar region: Secondary | ICD-10-CM | POA: Diagnosis not present

## 2021-08-10 DIAGNOSIS — M9901 Segmental and somatic dysfunction of cervical region: Secondary | ICD-10-CM | POA: Diagnosis not present

## 2021-08-10 DIAGNOSIS — M5412 Radiculopathy, cervical region: Secondary | ICD-10-CM | POA: Diagnosis not present

## 2021-09-05 DIAGNOSIS — J069 Acute upper respiratory infection, unspecified: Secondary | ICD-10-CM | POA: Diagnosis not present

## 2022-03-08 ENCOUNTER — Telehealth: Payer: Self-pay | Admitting: Internal Medicine

## 2022-03-08 ENCOUNTER — Telehealth (INDEPENDENT_AMBULATORY_CARE_PROVIDER_SITE_OTHER): Payer: BC Managed Care – PPO | Admitting: Family Medicine

## 2022-03-08 ENCOUNTER — Encounter: Payer: Self-pay | Admitting: Family Medicine

## 2022-03-08 VITALS — Ht 64.5 in | Wt 196.2 lb

## 2022-03-08 DIAGNOSIS — J3489 Other specified disorders of nose and nasal sinuses: Secondary | ICD-10-CM | POA: Diagnosis not present

## 2022-03-08 DIAGNOSIS — U071 COVID-19: Secondary | ICD-10-CM

## 2022-03-08 MED ORDER — FLUTICASONE PROPIONATE 50 MCG/ACT NA SUSP: 1.0000 | Freq: Every day | NASAL | 0 refills | Status: DC

## 2022-03-08 MED ORDER — MOLNUPIRAVIR EUA 200MG CAPSULE: 4.0000 | ORAL_CAPSULE | Freq: Two times a day (BID) | ORAL | 0 refills | Status: AC

## 2022-03-08 NOTE — Telephone Encounter (Signed)
Pt was seen today and tested positive for Covid. Pt's appt was changed to a VV.  Pt called from the parking lot to ask if he could have a Doctors Note for work? Pt stated he would need to take at least a week.  Please advise.

## 2022-03-08 NOTE — Progress Notes (Signed)
Virtual Visit via Video Note  I connected with Rodney Burgess on 03/08/22 at  2:30 PM EST by a video enabled telemedicine application 2/2 [id number] pandemic and verified that I am speaking with the correct person using two identifiers.  Location patient: home Location provider:work or home office Persons participating in the virtual visit: patient, provider, pt's daughter.  I discussed the limitations of evaluation and management by telemedicine and the availability of in person appointments. The patient expressed understanding and agreed to proceed.  Chief Complaint  Patient presents with   Covid Positive   Nasal Congestion    Patient complains of nasal congestion, x2 day   Sore Throat    Patient complains of sore throat, x2 days   Headache    Patient complains of headaches, x1 day    HPI:  Pt is a 62 yo male with pmh sig for DM II, hypertriglyceridemia, vitamin b12 def, and vitamin D def who is followed by Dr. Jerilee Hoh and seen for acute concern.  Pt with rhinorrhea, HA, ST, dry cough, sneezing since yesterday.  Denies n/v.  Sick contacts include his daughter who had covid.  Pt has not taken anything for his symptoms.  ROS: See pertinent positives and negatives per HPI.  Past Medical History:  Diagnosis Date   Acute bronchitis    Contact dermatitis and other eczema due to plants (except food)    Internal hemorrhoids    Localized superficial swelling, mass, or lump    Pain in joint, shoulder region    Personal history of colonic polyp - right sided hyperplastic (serrated) 07/09/2013   Prolapsed internal hemorrhoids 09/20/2018   Gr 2 internals and some external components   Syncope and collapse     Past Surgical History:  Procedure Laterality Date   COLONOSCOPY     HEMORRHOID BANDING  [id number]   UMBILICAL HERNIA REPAIR      Family History  Problem Relation Age of Onset   Colon cancer Neg Hx    Esophageal cancer Neg Hx    Rectal cancer Neg Hx    Stomach cancer Neg Hx      Current Outpatient Medications:    fluticasone (FLONASE) 50 MCG/ACT nasal spray, Place 1 spray into both nostrils daily., Disp: 16 g, Rfl: 0   molnupiravir EUA (LAGEVRIO) 200 mg CAPS capsule, Take 4 capsules (800 mg total) by mouth 2 (two) times daily for 5 days., Disp: 40 capsule, Rfl: 0   atorvastatin (LIPITOR) 20 MG tablet, Take 1 tablet (20 mg total) by mouth daily. (Patient not taking: Reported on 07/31/2021), Disp: 90 tablet, Rfl: 1  EXAM:  VITALS per patient if applicable:  RR between 12-20 bpm  GENERAL: alert, oriented, appears well and in no acute distress  HEENT: atraumatic, conjunctiva clear, no obvious abnormalities on inspection of external nose and ears  NECK: normal movements of the head and neck  LUNGS: on inspection no signs of respiratory distress, breathing rate appears normal, no obvious gross SOB, gasping or wheezing  CV: no obvious cyanosis  MS: moves all visible extremities without noticeable abnormality  PSYCH/NEURO: pleasant and cooperative, no obvious depression or anxiety, speech and thought processing grossly intact  ASSESSMENT AND PLAN:  Discussed the following assessment and plan:  COVID-19 virus infection - Plan: molnupiravir EUA (LAGEVRIO) 200 mg CAPS capsule  Rhinorrhea - Plan: fluticasone (FLONASE) 50 MCG/ACT nasal spray  Patient with positive COVID-19 test today 03/08/2022 with symptoms starting 1-2 days ago.  Discussed r/b/a of antiviral medications.  Patient wishes  to start med.  Rx for molnupiravir sent to pharmacy.  Flonase also sent to pharmacy discussed supportive care with OTC cough/cold medications.  Note printed for work.  Given strict precautions.  Follow-up as needed for continued or worsening symptoms.   I discussed the assessment and treatment plan with the patient. The patient was provided an opportunity to ask questions and all were answered. The patient agreed with the plan and demonstrated an understanding of the  instructions.   The patient was advised to call back or seek an in-person evaluation if the symptoms worsen or if the condition fails to improve as anticipated.  Billie Ruddy, MD

## 2022-03-08 NOTE — Telephone Encounter (Signed)
Note provided for patient

## 2022-08-14 DIAGNOSIS — M9901 Segmental and somatic dysfunction of cervical region: Secondary | ICD-10-CM | POA: Diagnosis not present

## 2022-08-14 DIAGNOSIS — M5412 Radiculopathy, cervical region: Secondary | ICD-10-CM | POA: Diagnosis not present

## 2022-08-17 DIAGNOSIS — M9901 Segmental and somatic dysfunction of cervical region: Secondary | ICD-10-CM | POA: Diagnosis not present

## 2022-08-17 DIAGNOSIS — M5412 Radiculopathy, cervical region: Secondary | ICD-10-CM | POA: Diagnosis not present

## 2022-08-27 DIAGNOSIS — M5412 Radiculopathy, cervical region: Secondary | ICD-10-CM | POA: Diagnosis not present

## 2022-08-27 DIAGNOSIS — M9901 Segmental and somatic dysfunction of cervical region: Secondary | ICD-10-CM | POA: Diagnosis not present

## 2022-08-28 DIAGNOSIS — M5412 Radiculopathy, cervical region: Secondary | ICD-10-CM | POA: Diagnosis not present

## 2022-08-28 DIAGNOSIS — M9901 Segmental and somatic dysfunction of cervical region: Secondary | ICD-10-CM | POA: Diagnosis not present

## 2022-09-05 DIAGNOSIS — M5412 Radiculopathy, cervical region: Secondary | ICD-10-CM | POA: Diagnosis not present

## 2022-09-05 DIAGNOSIS — M9901 Segmental and somatic dysfunction of cervical region: Secondary | ICD-10-CM | POA: Diagnosis not present

## 2022-09-10 DIAGNOSIS — M9901 Segmental and somatic dysfunction of cervical region: Secondary | ICD-10-CM | POA: Diagnosis not present

## 2022-09-10 DIAGNOSIS — M5412 Radiculopathy, cervical region: Secondary | ICD-10-CM | POA: Diagnosis not present

## 2022-09-14 DIAGNOSIS — M9901 Segmental and somatic dysfunction of cervical region: Secondary | ICD-10-CM | POA: Diagnosis not present

## 2022-09-14 DIAGNOSIS — M5412 Radiculopathy, cervical region: Secondary | ICD-10-CM | POA: Diagnosis not present

## 2022-09-20 DIAGNOSIS — M9901 Segmental and somatic dysfunction of cervical region: Secondary | ICD-10-CM | POA: Diagnosis not present

## 2022-09-20 DIAGNOSIS — M5412 Radiculopathy, cervical region: Secondary | ICD-10-CM | POA: Diagnosis not present

## 2023-01-14 ENCOUNTER — Telehealth: Payer: Self-pay | Admitting: Internal Medicine

## 2023-01-14 NOTE — Telephone Encounter (Signed)
Requesting to have his vitamin D checked

## 2023-01-14 NOTE — Telephone Encounter (Signed)
Office visit scheduled.

## 2023-01-28 ENCOUNTER — Ambulatory Visit: Payer: Self-pay | Admitting: Internal Medicine

## 2023-01-28 DIAGNOSIS — E119 Type 2 diabetes mellitus without complications: Secondary | ICD-10-CM

## 2023-03-12 ENCOUNTER — Encounter: Payer: Self-pay | Admitting: Internal Medicine

## 2023-03-12 ENCOUNTER — Ambulatory Visit (INDEPENDENT_AMBULATORY_CARE_PROVIDER_SITE_OTHER): Payer: Self-pay | Admitting: Internal Medicine

## 2023-03-12 VITALS — BP 110/70 | HR 90 | Temp 97.5°F | Ht 65.0 in | Wt 200.7 lb

## 2023-03-12 DIAGNOSIS — E559 Vitamin D deficiency, unspecified: Secondary | ICD-10-CM | POA: Diagnosis not present

## 2023-03-12 DIAGNOSIS — E119 Type 2 diabetes mellitus without complications: Secondary | ICD-10-CM

## 2023-03-12 DIAGNOSIS — E1169 Type 2 diabetes mellitus with other specified complication: Secondary | ICD-10-CM | POA: Diagnosis not present

## 2023-03-12 DIAGNOSIS — E781 Pure hyperglyceridemia: Secondary | ICD-10-CM

## 2023-03-12 DIAGNOSIS — G4733 Obstructive sleep apnea (adult) (pediatric): Secondary | ICD-10-CM

## 2023-03-12 DIAGNOSIS — Z0000 Encounter for general adult medical examination without abnormal findings: Secondary | ICD-10-CM

## 2023-03-12 DIAGNOSIS — E538 Deficiency of other specified B group vitamins: Secondary | ICD-10-CM

## 2023-03-12 DIAGNOSIS — E785 Hyperlipidemia, unspecified: Secondary | ICD-10-CM

## 2023-03-12 LAB — CBC WITH DIFFERENTIAL/PLATELET
Basophils Absolute: 0.1 10*3/uL (ref 0.0–0.1)
Basophils Relative: 0.9 % (ref 0.0–3.0)
Eosinophils Absolute: 0.4 10*3/uL (ref 0.0–0.7)
Eosinophils Relative: 3.9 % (ref 0.0–5.0)
HCT: 47.4 % (ref 39.0–52.0)
Hemoglobin: 15.9 g/dL (ref 13.0–17.0)
Lymphocytes Relative: 42.4 % (ref 12.0–46.0)
Lymphs Abs: 4.3 10*3/uL — ABNORMAL HIGH (ref 0.7–4.0)
MCHC: 33.5 g/dL (ref 30.0–36.0)
MCV: 87.8 fL (ref 78.0–100.0)
Monocytes Absolute: 0.9 10*3/uL (ref 0.1–1.0)
Monocytes Relative: 9 % (ref 3.0–12.0)
Neutro Abs: 4.5 10*3/uL (ref 1.4–7.7)
Neutrophils Relative %: 43.8 % (ref 43.0–77.0)
Platelets: 267 10*3/uL (ref 150.0–400.0)
RBC: 5.4 Mil/uL (ref 4.22–5.81)
RDW: 14.2 % (ref 11.5–15.5)
WBC: 10.2 10*3/uL (ref 4.0–10.5)

## 2023-03-12 LAB — HEMOGLOBIN A1C: Hgb A1c MFr Bld: 7.2 % — ABNORMAL HIGH (ref 4.6–6.5)

## 2023-03-12 LAB — COMPREHENSIVE METABOLIC PANEL
ALT: 35 U/L (ref 0–53)
AST: 23 U/L (ref 0–37)
Albumin: 4.2 g/dL (ref 3.5–5.2)
Alkaline Phosphatase: 82 U/L (ref 39–117)
BUN: 11 mg/dL (ref 6–23)
CO2: 26 meq/L (ref 19–32)
Calcium: 9 mg/dL (ref 8.4–10.5)
Chloride: 102 meq/L (ref 96–112)
Creatinine, Ser: 0.74 mg/dL (ref 0.40–1.50)
GFR: 98.23 mL/min (ref >60.00)
Glucose, Bld: 106 mg/dL — ABNORMAL HIGH (ref 70–99)
Potassium: 4.3 meq/L (ref 3.5–5.1)
Sodium: 136 meq/L (ref 135–145)
Total Bilirubin: 0.8 mg/dL (ref 0.2–1.2)
Total Protein: 7.1 g/dL (ref 6.0–8.3)

## 2023-03-12 LAB — PSA: PSA: 0.93 ng/mL (ref 0.10–4.00)

## 2023-03-12 LAB — LIPID PANEL
Cholesterol: 181 mg/dL (ref 0–200)
HDL: 34.7 mg/dL — ABNORMAL LOW (ref >39.00)
LDL Cholesterol: 90 mg/dL (ref 0–99)
NonHDL: 146.63
Total CHOL/HDL Ratio: 5
Triglycerides: 283 mg/dL — ABNORMAL HIGH (ref 0.0–149.0)
VLDL: 56.6 mg/dL — ABNORMAL HIGH (ref 0.0–40.0)

## 2023-03-12 LAB — TSH: TSH: 2.73 u[IU]/mL (ref 0.35–5.50)

## 2023-03-12 LAB — VITAMIN B12: Vitamin B-12: 512 pg/mL (ref 211–911)

## 2023-03-12 LAB — VITAMIN D 25 HYDROXY (VIT D DEFICIENCY, FRACTURES): VITD: 37.2 ng/mL (ref 30.00–100.00)

## 2023-03-12 NOTE — Progress Notes (Signed)
Established Patient Office Visit     CC/Reason for Visit: Annual preventive exam, follow-up chronic medical conditions, discuss acute concern  HPI: Rodney Burgess is a 63 y.o. male who is coming in today for the above mentioned reasons. Past Medical History is significant for: Type 2 diabetes, hyperlipidemia, vitamin D and B12 deficiencies.  He feels well without acute concerns or complaints.  Has routine eye and dental care.  Is due for COVID and RSV vaccines.  Had a colonoscopy in 2020.  He says he has his routine annual diabetic eye exams but does not remember the name of his practitioner.  His wife has told him that he snores terribly, he frequently has daytime fatigue.   Past Medical/Surgical History: Past Medical History:  Diagnosis Date   Acute bronchitis    Contact dermatitis and other eczema due to plants (except food)    Internal hemorrhoids    Localized superficial swelling, mass, or lump    Pain in joint, shoulder region    Personal history of colonic polyp - right sided hyperplastic (serrated) 07/09/2013   Prolapsed internal hemorrhoids 09/20/2018   Gr 2 internals and some external components   Syncope and collapse     Past Surgical History:  Procedure Laterality Date   COLONOSCOPY     HEMORRHOID BANDING  08/2018   UMBILICAL HERNIA REPAIR      Social History:  reports that he has quit smoking. He has never used smokeless tobacco. He reports current alcohol use. He reports that he does not use drugs.  Allergies: No Known Allergies  Family History:  Family History  Problem Relation Age of Onset   Colon cancer Neg Hx    Esophageal cancer Neg Hx    Rectal cancer Neg Hx    Stomach cancer Neg Hx      Current Outpatient Medications:    fluticasone (FLONASE) 50 MCG/ACT nasal spray, Place 1 spray into both nostrils daily., Disp: 16 g, Rfl: 0   atorvastatin (LIPITOR) 20 MG tablet, Take 1 tablet (20 mg total) by mouth daily. (Patient not taking: Reported on  03/12/2023), Disp: 90 tablet, Rfl: 1  Review of Systems:  Negative unless indicated in HPI.   Physical Exam: Vitals:   03/12/23 0829  BP: 110/70  Pulse: 90  Temp: (!) 97.5 F (36.4 C)  TempSrc: Oral  SpO2: 96%  Weight: 200 lb 11.2 oz (91 kg)  Height: 5\' 5"  (1.651 m)    Body mass index is 33.4 kg/m.   Physical Exam Vitals reviewed.  Constitutional:      General: He is not in acute distress.    Appearance: Normal appearance. He is obese. He is not ill-appearing, toxic-appearing or diaphoretic.  HENT:     Head: Normocephalic.     Right Ear: Tympanic membrane, ear canal and external ear normal. There is no impacted cerumen.     Left Ear: Tympanic membrane, ear canal and external ear normal. There is no impacted cerumen.     Nose: Nose normal.     Mouth/Throat:     Mouth: Mucous membranes are moist.     Pharynx: Oropharynx is clear. No oropharyngeal exudate or posterior oropharyngeal erythema.  Eyes:     General: No scleral icterus.       Right eye: No discharge.        Left eye: No discharge.     Conjunctiva/sclera: Conjunctivae normal.     Pupils: Pupils are equal, round, and reactive to light.  Neck:  Vascular: No carotid bruit.  Cardiovascular:     Rate and Rhythm: Normal rate and regular rhythm.     Pulses: Normal pulses.     Heart sounds: Normal heart sounds.  Pulmonary:     Effort: Pulmonary effort is normal. No respiratory distress.     Breath sounds: Normal breath sounds.  Abdominal:     General: Abdomen is flat. Bowel sounds are normal.     Palpations: Abdomen is soft.  Musculoskeletal:        General: Normal range of motion.     Cervical back: Normal range of motion.  Skin:    General: Skin is warm and dry.  Neurological:     General: No focal deficit present.     Mental Status: He is alert and oriented to person, place, and time. Mental status is at baseline.  Psychiatric:        Mood and Affect: Mood normal.        Behavior: Behavior normal.         Thought Content: Thought content normal.        Judgment: Judgment normal.     Flowsheet Row Office Visit from 03/12/2023 in [facility]  PHQ-9 Total Score 0        Impression and Plan:  Encounter for preventive health examination -     PSA; Future  Vitamin D deficiency -     VITAMIN D 25 Hydroxy (Vit-D Deficiency, Fractures); Future  Type 2 diabetes mellitus without complication, without long-term current use of insulin (HCC) -     CBC with Differential/Platelet; Future -     Lipid panel; Future -     Comprehensive metabolic panel; Future -     TSH; Future -     Hemoglobin A1c; Future -     Microalbumin / creatinine urine ratio; Future  Hyperlipidemia associated with type 2 diabetes mellitus (HCC)  Vitamin B12 deficiency -     Vitamin B12; Future  Hypertriglyceridemia  OSA (obstructive sleep apnea) -     Ambulatory referral to Neurology   -Recommend routine eye and dental care. -Healthy lifestyle discussed in detail. -Labs to be updated today. -Prostate cancer screening: PSA today Health Maintenance  Topic Date Due   Eye exam for diabetics  Never done   Hemoglobin A1C  01/31/2022   Yearly kidney function blood test for diabetes  04/29/2022   Yearly kidney health urinalysis for diabetes  08/01/2022   COVID-19 Vaccine (4 - 2024-25 season) 09/30/2022   Pneumococcal Vaccination (1 of 2 - PCV) 03/11/2024*   Colon Cancer Screening  09/03/2023   Complete foot exam   03/11/2024   DTaP/Tdap/Td vaccine (3 - Td or Tdap) 04/21/2025   Hepatitis C Screening  Completed   HIV Screening  Completed   Zoster (Shingles) Vaccine  Completed   HPV Vaccine  Aged Out  *Topic was postponed. The date shown is not the original due date.     -Advised to update COVID and RSV vaccines at pharmacy. -For his probable OSA I will place a referral to [facility] neurology for sleep testing and potential management. -We have discussed at length necessity  of taking atorvastatin.  He prefers to wait for results of blood work today before making a decision.    Chaya Jan, MD [facility]

## 2023-03-14 LAB — MICROALBUMIN / CREATININE URINE RATIO
Creatinine,U: 130.3 mg/dL
Microalb Creat Ratio: 6.9 mg/g (ref 0.0–30.0)
Microalb, Ur: 0.9 mg/dL (ref 0.0–1.9)

## 2023-04-03 ENCOUNTER — Encounter: Payer: Self-pay | Admitting: Internal Medicine

## 2023-04-09 ENCOUNTER — Ambulatory Visit: Payer: BC Managed Care – PPO | Admitting: Neurology

## 2023-04-09 ENCOUNTER — Encounter: Payer: Self-pay | Admitting: Neurology

## 2023-04-09 VITALS — BP 112/72 | HR 76 | Ht 65.0 in | Wt 205.0 lb

## 2023-04-09 DIAGNOSIS — E66812 Obesity, class 2: Secondary | ICD-10-CM

## 2023-04-09 DIAGNOSIS — R5383 Other fatigue: Secondary | ICD-10-CM | POA: Insufficient documentation

## 2023-04-09 DIAGNOSIS — R0681 Apnea, not elsewhere classified: Secondary | ICD-10-CM | POA: Diagnosis not present

## 2023-04-09 DIAGNOSIS — R0683 Snoring: Secondary | ICD-10-CM | POA: Diagnosis not present

## 2023-04-09 NOTE — Progress Notes (Signed)
 SLEEP MEDICINE CLINIC    Provider:  Melvyn Novas, MD  Primary Care Physician:  Philip Aspen, Limmie Patricia, MD [address] [place] [address]     Referring Provider: Philip Aspen, Limmie Patricia, Md [address] [place],  [place] [address]          Chief Complaint according to patient   Patient presents with:     New Patient (Initial Visit)  Pt is well, spouse reports he has significant snoring. Gets about 5-6 hrs of sleep a night. He does stop breathing in his sleep, per spouse. Pt. Reports  daytime fatigue, not sleepiness .            HISTORY OF PRESENT ILLNESS:  Rodney Burgess is a 63 y.o. male patient who is seen here in the presence of his wife , upon PCP referral on 04/09/2023 . Mr Rodney Burgess  has been snoring  very loudly for the last 3-4 years,  and has witnessed apneas. He has been  recorded and the audio confirms the pausing in his breathing for up to 15 seconds. He gained weight  since the pandemic 170 pounds, now 205.  He got COVID in 2021, 2022 and 2024.  His wife had home tested him.  Not always did he feel sick, but his wife found him hypoxic- no coughing. He had fever and malaise.    Chief concern according to patient : " I just wake my wife up with my snoring"   I have the pleasure of seeing Rodney Burgess 04/09/23 a right -handed male with a possible sleep disorder.     Sleep relevant medical history:  fatigue, Nocturia, 0-1 , rare GERD, several COVID infection,  No Tonsillectomy, no surgery but deviated septum , sinus disease.     Family medical /sleep history: No known family member with OSA, parents  in [place] mother  alive, father died at 3.    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  He  lives in a household with spouse, and 2 children at home-   Family status is married , with 2 children. The patient currently works in day shifts(  12 hours - 3 days  a week. Had night shifts until 2014  Tobacco use: none .   ETOH use ; 3-4/w ,  Caffeine intake in form of Coffee( 2 cups in AM ) Soda( /) Tea ( some- ) no energy drinks Exercise in form of none.  He is too fatigued. .         Sleep habits are as follows: The patient's dinner time is between  7-8 PM. The patient goes to bed at 10-11 PM and continues to sleep for 5-6 hours, wakes for 0-1 bathroom breaks, the first time at 2-3 AM.    Bedroom is dark, cool, quiet,  The preferred sleep position is laterally or supine- , with the support of 1 pillow, flat bed. .  Dreams are reportedly rare.   The patient wakes up spontaneously 4.45 before an alarm rings at 5 AM.. 5  AM is the usual rise time. He reports not feeling refreshed or restored in AM, with symptoms such as dry mouth, morning headaches in the forehead , throbbing. , and residual fatigue.  Naps are taken infrequently, lasting from 10 to 20 minutes and are refreshing.    Review of Systems: Out of a complete 14 system review, the  patient complains of only the following symptoms, and all other reviewed systems are negative.:  Fatigue, sleepiness , snoring, fragmented sleep, Insomnia,   How likely are you to doze in the following situations: 0 = not likely, 1 = slight chance, 2 = moderate chance, 3 = high chance   Sitting and Reading? Watching Television? Sitting inactive in a public place (theater or meeting)? As a passenger in a car for an hour without a break? Lying down in the afternoon when circumstances permit? Sitting and talking to someone? Sitting quietly after lunch without alcohol? In a car, while stopped for a few minutes in traffic?   Total = 4/ 24 points   FSS endorsed at 38/ 63 points.   GDS : 4/ 15   Morning headaches, sinus diease.   Social History   Socioeconomic History   Marital status: Married    Spouse name: Not on file   Number of children: Not on file   Years of education: Not on file   Highest  education level: Not on file  Occupational History   Not on file  Tobacco Use   Smoking status: Former   Smokeless tobacco: Never   Tobacco comments:    Patient states  that he only smoke once a month about  24 y  Psychologist, educational Use   Vaping status: Not on file  Substance and Sexual Activity   Alcohol use: Yes    Comment: occasional weekend   Drug use: No   Sexual activity: Yes  Other Topics Concern   Not on file  Social History Narrative   Lives at home with wife and two daughters.     Social Drivers of Corporate investment banker Strain: Not on file  Food Insecurity: Not on file  Transportation Needs: Not on file  Physical Activity: Not on file  Stress: Not on file  Social Connections: Not on file    Family History  Problem Relation Age of Onset   Colon cancer Neg Hx    Esophageal cancer Neg Hx    Rectal cancer Neg Hx    Stomach cancer Neg Hx     Past Medical History:  Diagnosis Date   Acute bronchitis    Contact dermatitis and other eczema due to plants (except food)    Internal hemorrhoids    Localized superficial swelling, mass, or lump    Pain in joint, shoulder region    Personal history of colonic polyp - right sided hyperplastic (serrated) 07/09/2013   Prolapsed internal hemorrhoids 09/20/2018   Gr 2 internals and some external components   Syncope and collapse     Past Surgical History:  Procedure Laterality Date   COLONOSCOPY     HEMORRHOID BANDING  08/2018   UMBILICAL HERNIA REPAIR       No current outpatient medications on file prior to visit.   No current facility-administered medications on file prior to visit.    No Known Allergies   DIAGNOSTIC DATA (LABS, IMAGING, TESTING) - I reviewed patient records, labs, notes, testing and imaging myself where available.  Lab Results  Component Value Date   WBC 10.2 03/12/2023   HGB 15.9 03/12/2023   HCT 47.4 03/12/2023   MCV 87.8 03/12/2023   PLT 267.0 03/12/2023      Component Value Date/Time    NA 136 03/12/2023 0905   K 4.3 03/12/2023 0905   CL 102 03/12/2023 0905   CO2 26 03/12/2023 0905   GLUCOSE 106 (H) 03/12/2023 0905  BUN 11 03/12/2023 0905   CREATININE 0.74 03/12/2023 0905   CALCIUM 9.0 03/12/2023 0905   PROT 7.1 03/12/2023 0905   ALBUMIN 4.2 03/12/2023 0905   AST 23 03/12/2023 0905   ALT 35 03/12/2023 0905   ALKPHOS 82 03/12/2023 0905   BILITOT 0.8 03/12/2023 0905   GFRNONAA 108.27 12/14/2009 1100   Lab Results  Component Value Date   CHOL 181 03/12/2023   HDL 34.70 (L) 03/12/2023   LDLCALC 90 03/12/2023   LDLDIRECT 114.0 06/17/2019   TRIG 283.0 (H) 03/12/2023   CHOLHDL 5 03/12/2023   Lab Results  Component Value Date   HGBA1C 7.2 (H) 03/12/2023   Lab Results  Component Value Date   VITAMINB12 512 03/12/2023   Lab Results  Component Value Date   TSH 2.73 03/12/2023    PHYSICAL EXAM:  Today's Vitals   04/09/23 1008  BP: 112/72  Pulse: 76  Weight: 205 lb (93 kg)  Height: 5\' 5"  (1.651 m)   Body mass index is 34.11 kg/m.   Wt Readings from Last 3 Encounters:  04/09/23 205 lb (93 kg)  03/12/23 200 lb 11.2 oz (91 kg)  03/08/22 196 lb 3.2 oz (89 kg)     Ht Readings from Last 3 Encounters:  04/09/23 5\' 5"  (1.651 m)  03/12/23 5\' 5"  (1.651 m)  03/08/22 5' 4.5" (1.638 m)      General: The patient is awake, alert and appears not in acute distress. The patient is well groomed. Head: Normocephalic, atraumatic. Neck is supple.  Mallampati 2,  neck circumference:19 inches .  Nasal airflow not patent.  Retrognathia is seen.  Dental status: biological, veneers.  Cardiovascular:  Regular rate and cardiac rhythm by pulse,  without distended neck veins. Respiratory: Lungs are clear to auscultation.  Skin:  Without evidence of ankle edema, or rash. Trunk: The patient's posture is erect.   NEUROLOGIC EXAM: The patient is awake and alert, oriented to place and time.   Memory subjective described as intact.  Attention span & concentration  ability appears normal.  Speech is fluent,  without  dysarthria, dysphonia or aphasia.  Mood and affect are appropriate.   Cranial nerves: no loss of smell or taste reported  Pupils are equal and briskly reactive to light. Funduscopic exam deferred .  Extraocular movements in vertical and horizontal planes were intact and without nystagmus. No Diplopia. Visual fields by finger perimetry are intact. Hearing was intact to soft voice and finger rubbing.    Facial sensation intact to fine touch.  Facial motor strength is symmetric and tongue and uvula move midline.  Neck ROM : rotation, tilt and flexion extension were normal for age and shoulder shrug was symmetrical.    Motor exam:  Symmetric bulk, tone and ROM.   Normal tone without cog wheeling, symmetric grip strength .   Sensory:  Fine touch and vibration were intact.   Coordination: Rapid alternating movements in the fingers/hands were of normal speed.  The Finger-to-nose maneuver was intact without evidence of ataxia, dysmetria or tremor.   Gait and station: Patient could rise unassisted from a seated position, walked without assistive device.  Stance is of normal width/ base and the patient turned with 3 steps.  Toe and heel walk were deferred.  Deep tendon reflexes: in the  upper and lower extremities are symmetric and intact.  Babinski response was deferred.      ASSESSMENT AND PLAN 63 y.o.  male patient of [place] descent , born [place]  here with:    1) Daytime fatigue and not so much sleepiness.  Waking with morning headaches.   2) has gained weight and related to this has increased his snoring Volume, he has now long apnoeic pauses captured on audio and replayed here.   3) HST is main goal.       I plan to follow up either personally or through our NP within 4-6 months.   I would like to thank Philip Aspen, Limmie Patricia, MD and Philip Aspen, Limmie Patricia, Md [address] [place],  [place] [address]  for allowing me to meet with and to take care of this pleasant patient.     After spending a total time of  45  minutes face to face and additional time for physical and neurologic examination, review of laboratory studies,  personal review of imaging studies, reports and results of other testing and review of referral information / records as far as provided in visit,   Electronically signed by: Melvyn Novas, MD 04/09/2023 10:14 AM  [facility] Board certified by The [employer] of Sleep Medicine and Diplomate of the [employer] of Sleep Medicine. Board certified In Neurology through the ABPN, Fellow of the [employer] of Neurology.

## 2023-05-03 ENCOUNTER — Ambulatory Visit: Admitting: Neurology

## 2023-05-03 DIAGNOSIS — G4733 Obstructive sleep apnea (adult) (pediatric): Secondary | ICD-10-CM

## 2023-05-03 DIAGNOSIS — R0683 Snoring: Secondary | ICD-10-CM

## 2023-05-03 DIAGNOSIS — R5383 Other fatigue: Secondary | ICD-10-CM

## 2023-05-03 DIAGNOSIS — E66812 Obesity, class 2: Secondary | ICD-10-CM

## 2023-05-03 DIAGNOSIS — R0681 Apnea, not elsewhere classified: Secondary | ICD-10-CM

## 2023-05-21 NOTE — Progress Notes (Signed)
 [facility] 63 year old male 1960/10/11   HOME SLEEP TEST REPORT ( by Katheen Palma  mail -out device )   STUDY DATE:  05-09-2023 Data received : 05-21-2023    ORDERING CLINICIAN: Neomia Banner, MD  REFERRING CLINICIAN:  Zilphia Hilt, Charyl Coppersmith, MD   CLINICAL INFORMATION/HISTORY: 04-09-2023 Rodney Burgess is a 63 y.o. male patient who is seen here in the presence of his wife , upon PCP referral on 04/09/2023 . Mr Scotte Gildea has been snoring very loudly for the last 3-4 years and his wife has witnessed apneas. His snoring has been  recorded and the audio confirms the pausing in his breathe for up to 15 seconds. He gained weight since the pandemic , from 170 pounds, now weighing 205.  He got COVID in 2021, 2022 and 2024.  His wife had home tested him.  Not always did he feel sick, but his wife found him hypoxic- no coughing. He had fever and malaise.    Epworth sleepiness score: 4/ 24 points  FSS endorsed at 38/ 63 points. GDS : 4/ 15 Morning headaches, sinus diease.  BMI: 34.11  kg/m Neck Circumference: 19"    FINDINGS: Sleep Summary:   Total Recording Time (hours, min):   The total recording time was 7 hours and 21 minutes  Total Sleep Time (hours, min):   6 hours and 20 minutes, following a 6-minute sleep latency  Sleep efficiency %; 86%                                     Respiratory Indices: by AASM criteria :   Calculated pAHI (per hour):   14.1/h                                              Positional  respiratory activity  / snoring : most severe snoring in the first 2 hours of sleep, related to supine sleep position .   Oxygen Saturation  in Sleep    Oxygen Saturation (%) Mean: 93.7%                   O2 Saturation Range (%):      Between 54% and 99%                                  O2 Saturation (minutes) <89%: 6 m          Pulse Rate in Sleep :   Pulse Mean (bpm):   82 bpm              Pulse Range: In NSR between 67 and  107 bpm.              IMPRESSION:  This HST confirms the presence of mild obstructive sleep apnea without significant hypoxia time. This HST captured moderate - loud snoring.   RECOMMENDATION: This mild degree of apnea and more severe snoring can be treated by dental device or CPAP . Weight loss can contribute to reduction in apnea and snoring and is highly recommended.   An autotitration CPAP device can be ordered -if the patient agrees.  CPAP 5- 15 cm water, 2  cm EPR, interface of choice to be fitted.   PS : Mild sleep apnea is usually not treated by  implanted nerve stimulator.     NTERPRETING PHYSICIAN:   Neomia Banner, MD  [facility] Sleep Board certified by The [employer] of Sleep Medicine and Diplomate of the [employer] of Sleep Medicine. Board certified In Neurology through the ABPN, Fellow of the [employer] of Neurology.

## 2023-05-26 NOTE — Progress Notes (Incomplete)
          [facility] 63 year old male [birth year]   HOME SLEEP TEST REPORT ( by Katheen Palma  mail -out device )   STUDY DATE:  05-09-2023 Data received : 05-21-2023    ORDERING CLINICIAN: Neomia Banner, MD  REFERRING CLINICIAN:  Zilphia Hilt, Charyl Coppersmith, MD   CLINICAL INFORMATION/HISTORY: 04-09-2023 Rodney Burgess is a 63 y.o. male patient who is seen here in the presence of his wife , upon PCP referral on 04/09/2023 . Mr Rodney Burgess has been snoring very loudly for the last 3-4 years and his wife has witnessed apneas. His snoring has been  recorded and the audio confirms the pausing in his breathe for up to 15 seconds. He gained weight since the pandemic , from 170 pounds, now weighing 205.  He got COVID in 2021, 2022 and 2024.  His wife had home tested him.  Not always did he feel sick, but his wife found him hypoxic- no coughing. He had fever and malaise.    Epworth sleepiness score: 4/ 24 points  FSS endorsed at 38/ 63 points. GDS : 4/ 15 Morning headaches, sinus diease.  BMI: 34.11  kg/m Neck Circumference: 19"    FINDINGS: Sleep Summary:   Total Recording Time (hours, min):   The total recording time was 7 hours and 21 minutes  Total Sleep Time (hours, min):   6 hours and 20 minutes, following a 6-minute sleep latency  Sleep efficiency %; 86%                                     Respiratory Indices: by AASM criteria :   Calculated pAHI (per hour):   14.1/h                                              Positional  respiratory activity  / snoring : most severe snoring in the first 2 hours of sleep, related to supine sleep position .   Oxygen Saturation  in Sleep    Oxygen Saturation (%) Mean: 93.7%                   O2 Saturation Range (%):      Between 54% and 99%                                  O2 Saturation (minutes) <89%: 6 m          Pulse Rate in Sleep :   Pulse Mean (bpm):   82 bpm              Pulse Range:    bewtween 67 and 107  bpm.              IMPRESSION:  This HST confirms the presence of    RECOMMENDATION:    NTERPRETING PHYSICIAN:   Neomia Banner, MD  [facility] Board certified by The [employer] of Sleep Medicine and Diplomate of the [employer] of Sleep Medicine. Board certified In Neurology through the ABPN, Fellow of the [employer] of Neurology.

## 2023-05-27 ENCOUNTER — Encounter: Payer: Self-pay | Admitting: Neurology

## 2023-05-27 NOTE — Procedures (Signed)
 [facility] 63 year old male Dec 20, 1960   HOME SLEEP TEST REPORT ( by Katheen Palma  mail -out device )   STUDY DATE:  05-09-2023 Data received : 05-21-2023    ORDERING CLINICIAN: Neomia Banner, MD  REFERRING CLINICIAN:  Zilphia Hilt, Charyl Coppersmith, MD   CLINICAL INFORMATION/HISTORY: 04-09-2023 Rodney Burgess is a 63 y.o. male patient who is seen here in the presence of his wife , upon PCP referral on 04/09/2023 . Rodney Burgess has been snoring very loudly for the last 3-4 years and his wife has witnessed apneas. His snoring has been  recorded and the audio confirms the pausing in his breathe for up to 15 seconds. He gained weight since the pandemic , from 170 pounds, now weighing 205.  He got COVID in 2021, 2022 and 2024.  His wife had home tested him.  Not always did he feel sick, but his wife found him hypoxic- no coughing. He had fever and malaise.    Epworth sleepiness score: 4/ 24 points  FSS endorsed at 38/ 63 points. GDS : 4/ 15 Morning headaches, sinus diease.  BMI: 34.11  kg/m Neck Circumference: 19"    FINDINGS: Sleep Summary:   Total Recording Time (hours, min):   The total recording time was 7 hours and 21 minutes  Total Sleep Time (hours, min):   6 hours and 20 minutes, following a 6-minute sleep latency  Sleep efficiency %; 86%                                     Respiratory Indices: by AASM criteria :   Calculated pAHI (per hour):   14.1/h                                              Positional  respiratory activity  / snoring : most severe snoring in the first 2 hours of sleep, related to supine sleep position .   Oxygen Saturation  in Sleep    Oxygen Saturation (%) Mean: 93.7%                   O2 Saturation Range (%):      Between 54% and 99%                                  O2 Saturation (minutes) <89%: 6 m          Pulse Rate in Sleep :   Pulse Mean (bpm):   82 bpm              Pulse Range: In NSR between 67 and 107  bpm.              IMPRESSION:  This HST confirms the presence of mild obstructive sleep apnea without significant hypoxia time. This HST captured moderate - loud snoring.   RECOMMENDATION: This mild degree of apnea and more severe snoring can be treated by dental device or CPAP . Weight loss can contribute to reduction in apnea and snoring and is highly recommended.   An autotitration CPAP device can be ordered -if the patient agrees.  CPAP 5- 15 cm water, 2 cm EPR,  interface of choice to be fitted.   PS : Mild sleep apnea is usually not treated by  implanted nerve stimulator.     NTERPRETING PHYSICIAN:   Neomia Banner, MD  [facility] and [facility] Sleep Board certified by The [employer] of Sleep Medicine and Diplomate of the [employer] of [employer]. Board certified In Neurology through the ABPN, Fellow of the [employer] of Neu[employer].

## 2023-05-28 NOTE — Telephone Encounter (Signed)
LVM for pt to call about results. °

## 2023-06-05 ENCOUNTER — Encounter: Payer: Self-pay | Admitting: Neurology

## 2023-07-11 LAB — HM DIABETES EYE EXAM

## 2023-07-24 ENCOUNTER — Encounter: Payer: Self-pay | Admitting: Internal Medicine

## 2023-07-24 ENCOUNTER — Ambulatory Visit: Admitting: Internal Medicine

## 2023-07-24 VITALS — BP 110/70 | HR 84 | Temp 97.8°F | Wt 201.6 lb

## 2023-07-24 DIAGNOSIS — E538 Deficiency of other specified B group vitamins: Secondary | ICD-10-CM

## 2023-07-24 DIAGNOSIS — M25562 Pain in left knee: Secondary | ICD-10-CM | POA: Diagnosis not present

## 2023-07-24 DIAGNOSIS — E119 Type 2 diabetes mellitus without complications: Secondary | ICD-10-CM

## 2023-07-24 DIAGNOSIS — E785 Hyperlipidemia, unspecified: Secondary | ICD-10-CM

## 2023-07-24 DIAGNOSIS — E559 Vitamin D deficiency, unspecified: Secondary | ICD-10-CM

## 2023-07-24 DIAGNOSIS — E1169 Type 2 diabetes mellitus with other specified complication: Secondary | ICD-10-CM | POA: Diagnosis not present

## 2023-07-24 DIAGNOSIS — G8929 Other chronic pain: Secondary | ICD-10-CM

## 2023-07-24 LAB — POCT GLYCOSYLATED HEMOGLOBIN (HGB A1C): Hemoglobin A1C: 7.2 % — AB (ref 4.0–5.6)

## 2023-07-24 MED ORDER — ROSUVASTATIN CALCIUM 5 MG PO TABS: 5.0000 mg | ORAL_TABLET | Freq: Every day | ORAL | 1 refills | Status: AC

## 2023-07-24 NOTE — Assessment & Plan Note (Signed)
 A1c remains fairly well-controlled at 7.2.  He is insistent on working on lifestyle changes and does not want medication at this time.

## 2023-07-24 NOTE — Progress Notes (Signed)
 Established Patient Office Visit     CC/Reason for Visit: Follow-up chronic medical conditions  HPI: Rodney Burgess is a 63 y.o. male who is coming in today for the above mentioned reasons. Past Medical History is significant for: Type 2 diabetes, hyperlipidemia, vitamin D  and B12 deficiency.  He was referred to [facility] neurology for potential sleep apnea and is awaiting sleep study results.  He has chronic left knee pain and osteoarthritis.  When [place] visited [place] ago he had a cortisone injection which provided relief but his knee started to hurt again.   Past Medical/Surgical History: Past Medical History:  Diagnosis Date   Acute bronchitis    Contact dermatitis and other eczema due to plants (except food)    Internal hemorrhoids    Localized superficial swelling, mass, or lump    Pain in joint, shoulder region    Personal history of colonic polyp - right sided hyperplastic (serrated) 07/09/2013   Prolapsed internal hemorrhoids 09/20/2018   Gr 2 internals and some external components   Syncope and collapse     Past Surgical History:  Procedure Laterality Date   COLONOSCOPY     HEMORRHOID BANDING  08/2018   UMBILICAL HERNIA REPAIR      Social History:  reports that he has quit smoking. He has never used smokeless tobacco. He reports current alcohol use. He reports that he does not use drugs.  Allergies: No Known Allergies  Family History:  Family History  Problem Relation Age of Onset   Colon cancer Neg Hx    Esophageal cancer Neg Hx    Rectal cancer Neg Hx    Stomach cancer Neg Hx      Current Outpatient Medications:    rosuvastatin (CRESTOR) 5 MG tablet, Take 1 tablet (5 mg total) by mouth daily., Disp: 90 tablet, Rfl: 1  Review of Systems:  Negative unless indicated in HPI.   Physical Exam: Vitals:   07/24/23 0801  BP: 110/70  Pulse: 84  Temp: 97.8 F (36.6 C)  TempSrc: Oral  SpO2: 97%  Weight: 201 lb 9.6 oz (91.4 kg)     Body mass index is 33.55 kg/m.   Physical Exam Vitals reviewed.  Constitutional:      Appearance: Normal appearance. He is obese.  HENT:     Head: Normocephalic and atraumatic.   Eyes:     Conjunctiva/sclera: Conjunctivae normal.    Cardiovascular:     Rate and Rhythm: Normal rate and regular rhythm.  Pulmonary:     Effort: Pulmonary effort is normal.     Breath sounds: Normal breath sounds.   Skin:    General: Skin is warm and dry.   Neurological:     General: No focal deficit present.     Mental Status: He is alert and oriented to person, place, and time.   Psychiatric:        Mood and Affect: Mood normal.        Behavior: Behavior normal.        Thought Content: Thought content normal.        Judgment: Judgment normal.      Impression and Plan:  Type 2 diabetes mellitus without complication, without long-term current use of insulin (HCC) Assessment & Plan: A1c remains fairly well-controlled at 7.2.  He is insistent on working on lifestyle changes and does not want medication at this time.  Orders: -     POCT glycosylated hemoglobin (Hb A1C)  Vitamin  B12 deficiency Assessment & Plan: Looked good at last check.   Vitamin D  deficiency Assessment & Plan: Looked good at last check.   Hyperlipidemia associated with type 2 diabetes mellitus (HCC) Assessment & Plan: Most recent lipid panel with a total cholesterol of 181, LDL 90, triglycerides 283 and HDL 34.  Start rosuvastatin 5 mg daily.  Recheck lipids in 3 months.  Orders: -     Rosuvastatin Calcium ; Take 1 tablet (5 mg total) by mouth daily.  Dispense: 90 tablet; Refill: 1  Chronic pain of left knee -     Ambulatory referral to Orthopedic Surgery     Time spent:31 minutes reviewing chart, interviewing and examining patient and formulating plan of care.     Tully Theophilus Andrews, MD [facility]

## 2023-07-24 NOTE — Assessment & Plan Note (Signed)
 Looked good at last check.

## 2023-07-24 NOTE — Assessment & Plan Note (Signed)
 Most recent lipid panel with a total cholesterol of 181, LDL 90, triglycerides 283 and HDL 34.  Start rosuvastatin 5 mg daily.  Recheck lipids in 3 months.

## 2023-07-30 ENCOUNTER — Ambulatory Visit: Admitting: Orthopaedic Surgery

## 2023-07-30 ENCOUNTER — Other Ambulatory Visit (INDEPENDENT_AMBULATORY_CARE_PROVIDER_SITE_OTHER): Payer: Self-pay

## 2023-07-30 DIAGNOSIS — M25562 Pain in left knee: Secondary | ICD-10-CM

## 2023-07-30 MED ORDER — MELOXICAM 7.5 MG PO TABS: 7.5000 mg | ORAL_TABLET | Freq: Two times a day (BID) | ORAL | 2 refills | Status: DC | PRN

## 2023-07-30 NOTE — Progress Notes (Signed)
 Office Visit Note   Patient: Rodney Burgess           Date of Birth: 1961-01-19           MRN: [id number] Visit Date: 07/30/2023              Requested by: Theophilus Andrews, Tully GRADE, MD [address] [place],  [place] [address] PCP: Theophilus Andrews, Tully GRADE, MD   Assessment & Plan: Visit Diagnoses:  1. Left knee pain, unspecified chronicity     Plan: History of Present Illness Rodney Burgess is a 63 year old male who presents with left knee pain characterized by popping, locking, and giving way.  He experiences symptoms primarily when ascending and descending stairs, with discomfort localized behind the kneecap. These symptoms have persisted for approximately one year without any specific injury. The knee is swollen, but there is no pain with movement or pressure along the joint line.  Physical Exam MUSCULOSKELETAL: No effusion, normal range of motion, no pain on movement, and no tenderness along the joint line of the left knee.  Results RADIOLOGY Left knee X-ray: No fractures or dislocations.  Assessment and Plan Patellofemoral arthritis Chronic patellofemoral arthritis with symptoms of popping, locking, and giving way, exacerbated by weight-bearing activities. X-rays show no fractures or dislocations.  - Prescribed meloxicam for anti-inflammatory effect. - Provided home exercises to strengthen quadriceps and address hamstring tightness. - Advised weight loss to reduce knee stress, especially during stair use. - Sent prescription to pharmacy.  Follow-Up Instructions: No follow-ups on file.   Orders:  Orders Placed This Encounter  Procedures   XR KNEE 3 VIEW LEFT   Meds ordered this encounter  Medications   meloxicam (MOBIC) 7.5 MG tablet    Sig: Take 1 tablet (7.5 mg total) by mouth 2 (two) times daily as needed for pain.    Dispense:  30 tablet    Refill:  2      Procedures: No procedures performed   Clinical Data: No additional  findings.   Subjective: Chief Complaint  Patient presents with   Left Knee - Pain    HPI  Review of Systems  Constitutional: Negative.   HENT: Negative.    Eyes: Negative.   Respiratory: Negative.    Cardiovascular: Negative.   Gastrointestinal: Negative.   Endocrine: Negative.   Genitourinary: Negative.   Skin: Negative.   Allergic/Immunologic: Negative.   Neurological: Negative.   Hematological: Negative.   Psychiatric/Behavioral: Negative.    All other systems reviewed and are negative.    Objective: Vital Signs: There were no vitals taken for this visit.  Physical Exam Vitals and nursing note reviewed.  Constitutional:      Appearance: He is well-developed.  HENT:     Head: Normocephalic and atraumatic.   Eyes:     Pupils: Pupils are equal, round, and reactive to light.   Pulmonary:     Effort: Pulmonary effort is normal.  Abdominal:     Palpations: Abdomen is soft.   Musculoskeletal:        General: Normal range of motion.     Cervical back: Neck supple.   Skin:    General: Skin is warm.   Neurological:     Mental Status: He is alert and oriented to person, place, and time.   Psychiatric:        Behavior: Behavior normal.        Thought Content: Thought content normal.        Judgment: Judgment  normal.     Ortho Exam  Specialty Comments:  No specialty comments available.  Imaging: XR KNEE 3 VIEW LEFT Result Date: 07/30/2023 X-rays of the left knee show no acute or structural abnormalities    PMFS History: Patient Active Problem List   Diagnosis Date Noted   Witnessed apneic spells 04/09/2023   Other fatigue 04/09/2023   Snoring 04/09/2023   Vitamin D  deficiency 06/18/2019   Vitamin B12 deficiency 06/18/2019   DM (diabetes mellitus), type 2 (HCC) 06/18/2019   Prolapsed internal hemorrhoids 09/20/2018   History of colonic polyps 07/09/2013   Hyperlipidemia associated with type 2 diabetes mellitus (HCC) 07/26/2011   Past Medical  History:  Diagnosis Date   Acute bronchitis    Contact dermatitis and other eczema due to plants (except food)    Internal hemorrhoids    Localized superficial swelling, mass, or lump    Pain in joint, shoulder region    Personal history of colonic polyp - right sided hyperplastic (serrated) 07/09/2013   Prolapsed internal hemorrhoids 09/20/2018   Gr 2 internals and some external components   Syncope and collapse     Family History  Problem Relation Age of Onset   Colon cancer Neg Hx    Esophageal cancer Neg Hx    Rectal cancer Neg Hx    Stomach cancer Neg Hx     Past Surgical History:  Procedure Laterality Date   COLONOSCOPY     HEMORRHOID BANDING  08/2018   UMBILICAL HERNIA REPAIR     Social History   Occupational History   Not on file  Tobacco Use   Smoking status: Former   Smokeless tobacco: Never   Tobacco comments:    Patient states  that he only smoke once a month about  7 y  Psychologist, educational Use   Vaping status: Not on file  Substance and Sexual Activity   Alcohol use: Yes    Comment: occasional weekend   Drug use: No   Sexual activity: Yes

## 2023-08-07 ENCOUNTER — Encounter: Payer: Self-pay | Admitting: Internal Medicine

## 2023-09-19 ENCOUNTER — Telehealth: Payer: Self-pay | Admitting: *Deleted

## 2023-09-19 DIAGNOSIS — L247 Irritant contact dermatitis due to plants, except food: Secondary | ICD-10-CM | POA: Diagnosis not present

## 2023-09-19 DIAGNOSIS — L0889 Other specified local infections of the skin and subcutaneous tissue: Secondary | ICD-10-CM | POA: Diagnosis not present

## 2023-09-19 DIAGNOSIS — L989 Disorder of the skin and subcutaneous tissue, unspecified: Secondary | ICD-10-CM | POA: Diagnosis not present

## 2023-09-19 NOTE — Telephone Encounter (Signed)
 Spoke to male and offered an appointment.  She declined and stated that she will take him to urgent care.

## 2023-09-19 NOTE — Telephone Encounter (Signed)
 Copied from CRM #[id number]. Topic: Clinical - Medical Advice >> Sep 19, 2023  9:53 AM Chiquita SQUIBB wrote: Reason for CRM: Patient is calling in stating he has poison ivy  and is asking if something can be called in, patient states it is everywhere.

## 2023-10-21 ENCOUNTER — Encounter: Payer: Self-pay | Admitting: Internal Medicine

## 2023-12-02 ENCOUNTER — Encounter: Payer: Self-pay | Admitting: Radiology

## 2023-12-25 DIAGNOSIS — J342 Deviated nasal septum: Secondary | ICD-10-CM | POA: Diagnosis not present

## 2023-12-25 DIAGNOSIS — J343 Hypertrophy of nasal turbinates: Secondary | ICD-10-CM | POA: Diagnosis not present

## 2023-12-25 DIAGNOSIS — R0981 Nasal congestion: Secondary | ICD-10-CM | POA: Diagnosis not present

## 2024-01-11 ENCOUNTER — Encounter (HOSPITAL_BASED_OUTPATIENT_CLINIC_OR_DEPARTMENT_OTHER): Payer: Self-pay | Admitting: Emergency Medicine

## 2024-01-11 ENCOUNTER — Emergency Department (HOSPITAL_BASED_OUTPATIENT_CLINIC_OR_DEPARTMENT_OTHER)
Admission: EM | Admit: 2024-01-11 | Discharge: 2024-01-11 | Disposition: A | Discharge status: Home / Self Care | Attending: Emergency Medicine | Admitting: Emergency Medicine

## 2024-01-11 DIAGNOSIS — M546 Pain in thoracic spine: Secondary | ICD-10-CM | POA: Insufficient documentation

## 2024-01-11 MED ORDER — DIAZEPAM 5 MG PO TABS
5.0000 mg | ORAL_TABLET | Freq: Once | ORAL | Status: AC
  Administered 2024-01-11: 5 mg via ORAL
  Filled 2024-01-11: qty 1

## 2024-01-11 MED ORDER — ACETAMINOPHEN 500 MG PO TABS
1000.0000 mg | ORAL_TABLET | Freq: Once | ORAL | Status: AC
  Administered 2024-01-11: 1000 mg via ORAL
  Filled 2024-01-11: qty 2

## 2024-01-11 MED ORDER — METHOCARBAMOL 500 MG PO TABS: 500.0000 mg | ORAL_TABLET | Freq: Two times a day (BID) | ORAL | 0 refills | Status: AC

## 2024-01-11 MED ORDER — OXYCODONE HCL 5 MG PO TABS
5.0000 mg | ORAL_TABLET | Freq: Once | ORAL | Status: AC
  Administered 2024-01-11: 5 mg via ORAL
  Filled 2024-01-11: qty 1

## 2024-01-11 MED ORDER — KETOROLAC TROMETHAMINE 15 MG/ML IJ SOLN
15.0000 mg | Freq: Once | INTRAMUSCULAR | Status: AC
  Administered 2024-01-11: 15 mg via INTRAMUSCULAR
  Filled 2024-01-11: qty 1

## 2024-01-11 NOTE — Discharge Instructions (Signed)

## 2024-01-11 NOTE — ED Provider Notes (Signed)
 [place] EMERGENCY DEPARTMENT AT [facility] Provider Note   CSN: [id number] Arrival date & time: 01/11/24  1254     Patient presents with: Back Pain   Rodney Burgess is a 63 y.o. male.   63 yo M with a chief complaint of right sided thoracic back pain.  This has been going on for about an hour.  He said something similar happened on Monday he had a sudden severe pain to his back and he had to lay in a certain position and waited some time and it improved.  He denies trauma denies radiation denies loss of bowel or bladder denies loss of peritoneal sensation denies numbness or weakness to the arms or legs   Back Pain      Prior to Admission medications  Medication Sig Start Date End Date Taking? Authorizing Provider  methocarbamol  (ROBAXIN ) 500 MG tablet Take 1 tablet (500 mg total) by mouth 2 (two) times daily. 01/11/24  Yes Emil Share, DO  meloxicam  (MOBIC ) 7.5 MG tablet Take 1 tablet (7.5 mg total) by mouth 2 (two) times daily as needed for pain. 07/30/23   Jerri Kay HERO, MD  rosuvastatin  (CRESTOR ) 5 MG tablet Take 1 tablet (5 mg total) by mouth daily. 07/24/23   Theophilus Andrews, Tully GRADE, MD    Allergies: Patient has no known allergies.    Review of Systems  Musculoskeletal:  Positive for back pain.    Updated Vital Signs BP 128/87 (BP Location: Right Arm)   Pulse 97   Temp 97.8 F (36.6 C) (Oral)   Resp 18   SpO2 96%   Physical Exam Vitals and nursing note reviewed.  Constitutional:      Appearance: He is well-developed.  HENT:     Head: Normocephalic and atraumatic.  Eyes:     Pupils: Pupils are equal, round, and reactive to light.  Neck:     Vascular: No JVD.  Cardiovascular:     Rate and Rhythm: Normal rate and regular rhythm.     Heart sounds: No murmur heard.    No friction rub. No gallop.  Pulmonary:     Effort: No respiratory distress.     Breath sounds: No wheezing.  Abdominal:     General: There is no distension.     Tenderness:  There is no abdominal tenderness. There is no guarding or rebound.  Musculoskeletal:        General: Normal range of motion.     Cervical back: Normal range of motion and neck supple.     Comments: Patient with pinpoint tenderness to the musculature adjacent to the lower thoracic spine worst about the 10-12.  Reproduces his discomfort with rotation as well as palpation.  Pulse motor and sensation intact all 4 extremities.  Reflexes are 2+ and equal.  No clonus.  Skin:    Coloration: Skin is not pale.     Findings: No rash.  Neurological:     Mental Status: He is alert and oriented to person, place, and time.  Psychiatric:        Behavior: Behavior normal.     (all labs ordered are listed, but only abnormal results are displayed) Labs Reviewed - No data to display  EKG: None  Radiology: No results found.   Procedures   Medications Ordered in the ED  acetaminophen  (TYLENOL ) tablet 1,000 mg (has no administration in time range)  ketorolac  (TORADOL ) 15 MG/ML injection 15 mg (has no administration in time range)  oxyCODONE  (Oxy IR/ROXICODONE )  immediate release tablet 5 mg (has no administration in time range)  diazepam  (VALIUM ) tablet 5 mg (has no administration in time range)                                    Medical Decision Making Risk OTC drugs. Prescription drug management.   63 yo M with a chief complaints of right-sided thoracic back pain.  By history and physical exam this is most likely musculoskeletal.  No red flags.  Will treat supportively.  PCP follow-up.  1:35 PM:  I have discussed the diagnosis/risks/treatment options with the patient.  Evaluation and diagnostic testing in the emergency department does not suggest an emergent condition requiring admission or immediate intervention beyond what has been performed at this time.  They will follow up with PCP. We also discussed returning to the ED immediately if new or worsening sx occur. We discussed the sx which  are most concerning (e.g., sudden worsening pain, fever, inability to tolerate by mouth, cauda equina s/sx) that necessitate immediate return. Medications administered to the patient during their visit and any new prescriptions provided to the patient are listed below.  Medications given during this visit Medications  acetaminophen  (TYLENOL ) tablet 1,000 mg (has no administration in time range)  ketorolac  (TORADOL ) 15 MG/ML injection 15 mg (has no administration in time range)  oxyCODONE  (Oxy IR/ROXICODONE ) immediate release tablet 5 mg (has no administration in time range)  diazepam  (VALIUM ) tablet 5 mg (has no administration in time range)     The patient appears reasonably screen and/or stabilized for discharge and I doubt any other medical condition or other [facility] requiring further screening, evaluation, or treatment in the ED at this time prior to discharge.       Final diagnoses:  Acute right-sided thoracic back pain    ED Discharge Orders          Ordered    methocarbamol  (ROBAXIN ) 500 MG tablet  2 times daily        01/11/24 1327               Emil Share, DO 01/11/24 1335

## 2024-01-11 NOTE — ED Triage Notes (Signed)
 C/o lower back pain x 1 hr. Denies injuries to area. No urinary symptoms.

## 2024-01-13 DIAGNOSIS — M7918 Myalgia, other site: Secondary | ICD-10-CM | POA: Diagnosis not present

## 2024-01-13 DIAGNOSIS — M9904 Segmental and somatic dysfunction of sacral region: Secondary | ICD-10-CM | POA: Diagnosis not present

## 2024-01-13 DIAGNOSIS — M9902 Segmental and somatic dysfunction of thoracic region: Secondary | ICD-10-CM | POA: Diagnosis not present

## 2024-01-13 DIAGNOSIS — M9905 Segmental and somatic dysfunction of pelvic region: Secondary | ICD-10-CM | POA: Diagnosis not present

## 2024-01-13 DIAGNOSIS — M9903 Segmental and somatic dysfunction of lumbar region: Secondary | ICD-10-CM | POA: Diagnosis not present

## 2024-01-13 DIAGNOSIS — M25659 Stiffness of unspecified hip, not elsewhere classified: Secondary | ICD-10-CM | POA: Diagnosis not present

## 2024-01-14 ENCOUNTER — Encounter: Payer: Self-pay | Admitting: Internal Medicine

## 2024-01-14 ENCOUNTER — Ambulatory Visit: Admitting: Internal Medicine

## 2024-01-14 VITALS — BP 130/84 | HR 78 | Temp 98.0°F | Wt 193.0 lb

## 2024-01-14 DIAGNOSIS — M545 Low back pain, unspecified: Secondary | ICD-10-CM

## 2024-01-14 MED ORDER — MELOXICAM 7.5 MG PO TABS: 7.5000 mg | ORAL_TABLET | Freq: Every day | ORAL | 0 refills | Status: AC

## 2024-01-14 NOTE — Progress Notes (Signed)
° ° ° °  Established Patient Office Visit     CC/Reason for Visit: Right sided lower back pain  HPI: Rodney Burgess is a 63 y.o. male who is coming in today for the above mentioned reasons.  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  He went to the emergency department and was given Toradol  and Robaxin  that has had very minimal relief.  No sciatica.  Hurts with twisting movements of torso.   Past Medical/Surgical History: Past Medical History:  Diagnosis Date   Acute bronchitis    Contact dermatitis and other eczema due to plants (except food)    Internal hemorrhoids    Localized superficial swelling, mass, or lump    Pain in joint, shoulder region    Personal history of colonic polyp - right sided hyperplastic (serrated) 07/09/2013   Prolapsed internal hemorrhoids 09/20/2018   Gr 2 internals and some external components   Syncope and collapse     Past Surgical History:  Procedure Laterality Date   COLONOSCOPY     HEMORRHOID BANDING  08/2018   UMBILICAL HERNIA REPAIR      Social History:  reports that he has quit smoking. He has never used smokeless tobacco. He reports current alcohol use. He reports that he does not use drugs.  Allergies: Allergies[1]  Family History:  Family History  Problem Relation Age of Onset   Colon cancer Neg Hx    Esophageal cancer Neg Hx    Rectal cancer Neg Hx    Stomach cancer Neg Hx     Current Medications[2]  Review of Systems:  Negative unless indicated in HPI.   Physical Exam: Vitals:   01/14/24 1122  BP: 130/84  Pulse: 78  Temp: 98 F (36.7 C)  SpO2: 95%  Weight: 193 lb (87.5 kg)    Body mass index is 32.12 kg/m.    Impression and Plan:  Acute right-sided low back pain without sciatica -     Meloxicam ; Take 1 tablet (7.5 mg total) by mouth daily.  Dispense: 30 tablet; Refill: 0 -     Ambulatory referral to Physical Therapy   -Suspect this is simply musculoskeletal pain given lack of red  flag signs/symptoms. -Advised icing, as needed NSAIDs, back stretches, local massage therapy. - Have also given a 10-day course of meloxicam  and advised to continue Robaxin  prescribed in ED. - Will place referral to physical therapy.   Time spent:22 minutes reviewing chart, interviewing and examining patient and formulating plan of care.     Tully Theophilus Andrews, MD [facility]     [1] No Known Allergies [2]  Current Outpatient Medications:    meloxicam  (MOBIC ) 7.5 MG tablet, Take 1 tablet (7.5 mg total) by mouth daily., Disp: 30 tablet, Rfl: 0   rosuvastatin  (CRESTOR ) 5 MG tablet, Take 1 tablet (5 mg total) by mouth daily., Disp: 90 tablet, Rfl: 1   methocarbamol  (ROBAXIN ) 500 MG tablet, Take 1 tablet (500 mg total) by mouth 2 (two) times daily. (Patient not taking: Reported on 01/14/2024), Disp: 20 tablet, Rfl: 0

## 2024-02-05 ENCOUNTER — Ambulatory Visit: Attending: Internal Medicine

## 2024-02-05 ENCOUNTER — Other Ambulatory Visit: Payer: Self-pay

## 2024-02-05 DIAGNOSIS — M5459 Other low back pain: Secondary | ICD-10-CM | POA: Insufficient documentation

## 2024-02-05 DIAGNOSIS — R252 Cramp and spasm: Secondary | ICD-10-CM

## 2024-02-05 DIAGNOSIS — M6281 Muscle weakness (generalized): Secondary | ICD-10-CM | POA: Insufficient documentation

## 2024-02-05 NOTE — Progress Notes (Signed)
 " OUTPATIENT PHYSICAL THERAPY THORACOLUMBAR EVALUATION   Patient Name: Rodney Burgess MRN: [id number] DOB:July 01, 1960, 64 y.o., male Today's Date: 02/05/2024  END OF SESSION:  PT End of Session - 02/05/24 1100     Visit Number 1    Date for Recertification  04/01/24    Authorization Type BC[employer]- submitted for auth    PT Start Time 1014    PT Stop Time 1100    PT Time Calculation (min) 46 min    Activity Tolerance Patient tolerated treatment well    Behavior During Therapy WFL for tasks assessed/performed          Past Medical History:  Diagnosis Date   Acute bronchitis    Contact dermatitis and other eczema due to plants (except food)    Internal hemorrhoids    Localized superficial swelling, mass, or lump    Pain in joint, shoulder region    Personal history of colonic polyp - right sided hyperplastic (serrated) 07/09/2013   Prolapsed internal hemorrhoids 09/20/2018   Gr 2 internals and some external components   Syncope and collapse    Past Surgical History:  Procedure Laterality Date   COLONOSCOPY     HEMORRHOID BANDING  08/2018   UMBILICAL HERNIA REPAIR     Patient Active Problem List   Diagnosis Date Noted   Witnessed apneic spells 04/09/2023   Other fatigue 04/09/2023   Snoring 04/09/2023   Vitamin D  deficiency 06/18/2019   Vitamin B12 deficiency 06/18/2019   DM (diabetes mellitus), type 2 (HCC) 06/18/2019   Prolapsed internal hemorrhoids 09/20/2018   History of colonic polyps 07/09/2013   Hyperlipidemia associated with type 2 diabetes mellitus (HCC) 07/26/2011    PCP: Theophilus Andrews, Tully, MD  REFERRING PROVIDER: Theophilus Andrews, Tully, MD  REFERRING DIAG:  Diagnosis  M54.50 (ICD-10-CM) - Acute right-sided low back pain without sciatica    Rationale for Evaluation and Treatment: Rehabilitation  THERAPY DIAG:  Other low back pain - Plan: PT plan of care cert/re-cert  Muscle weakness (generalized) - Plan: PT plan of care cert/re-cert  Cramp  and spasm - Plan: PT plan of care cert/re-cert  ONSET DATE: 01/11/24  SUBJECTIVE:                                                                                                                                                                                           SUBJECTIVE STATEMENT: Pt presents to PT with LBP and bil LE pain that began 01/11/24 and he was walking.  No incident or injury.  No imaging has been done.    From MD on 01/14/24:  Rodney Burgess is a 64 y.o.  male who is coming in today for the above mentioned reasons.  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  He went to the emergency department and was given Toradol  and Robaxin  that has had very minimal relief.  No sciatica.  Hurts with twisting movements of torso.   PERTINENT HISTORY:  None   PAIN: 02/05/24 Are you having pain? Yes: NPRS scale: 6/10 now, 4-10/10 Pain location: Rt low back and bil legs  Pain description: stabbing, sharp Aggravating factors: constant, movement, both standing and sitting , sleep (waking 3-4x/night) Relieving factors: TENs unit, heat  PRECAUTIONS: None  RED FLAGS: None   WEIGHT BEARING RESTRICTIONS: No  FALLS:  Has patient fallen in last 6 months? No  LIVING ENVIRONMENT: Lives with: lives with their family Lives in: House/apartment   OCCUPATION: works as estate agent  PLOF: Independent  PATIENT GOALS: reduce back pain, work without pain, sleep without interruption   NEXT MD VISIT: none   OBJECTIVE:  Note: Objective measures were completed at Evaluation unless otherwise noted.  DIAGNOSTIC FINDINGS:  None   PATIENT SURVEYS:  02/05/24: Modified Oswestry:10/50=20% disability    COGNITION: Overall cognitive status: Within functional limits for tasks assessed     SENSATION: WFL  MUSCLE LENGTH: Bil hip flexibility limited by 25% with pain on Rt into Rt low back and quadratus  POSTURE: rounded shoulders, forward head, flexed trunk , and  diastasis recti with rolling   PALPATION: Marked palpable tenderness   LUMBAR ROM:   AROM eval  Flexion Limited by 25% with Rt sided pain  Extension WFLs with Rt lumbar pain  Right lateral flexion Limited by 25% with Rt back pain  Left lateral flexion Full with pain on Rt  Right rotation   Left rotation    (Blank rows = not tested)  LOWER EXTREMITY ROM:    Hip flexibility limited by 25% bil   LOWER EXTREMITY MMT:   5/5 bil LE strength with Rt sided pain with Rt LE testing  LUMBAR SPECIAL TESTS:  Slump test: Negative   GAIT: Distance walked: 50 feet  Assistive device utilized: None Level of assistance: Complete Independence Comments: WFLs   TREATMENT DATE:  02/05/24:  Findings from evaluation discussed, pt educated on plan of care, HEP initiated.      PATIENT EDUCATION:  Education details: Access Code: [email or web address], log roll, brief education regarding dry needling (didn't provide handout) Person educated: Patient Education method: Explanation, Demonstration, and Handouts Education comprehension: verbalized understanding and returned demonstration  HOME EXERCISE PROGRAM: Access Code: [email or web address] URL: [email or web address] Date: 02/05/2024 Prepared by: Burnard  Exercises - Supine Lower Trunk Rotation  - 2 x daily - 7 x weekly - 1 sets - 3 reps - 20 hold - Seated Hamstring Stretch  - 2-3 x daily - 7 x weekly - 1 sets - 3 reps - 20 hold - Child's Pose Stretch  - 2 x daily - 7 x weekly - 1 sets - 3-5 reps - 20 hold - Doorway Hip Flexor Stretch with Chair  - 3 x daily - 7 x weekly - 1 sets - 3 reps - 20 hold  ASSESSMENT:  CLINICAL IMPRESSION: Patient is a 64 y.o. male who was seen today for physical therapy evaluation and treatment for Rt sided LBP and LE pain.  This pain began ~3 weeks ago without cause and denies imaging.   Pt reports 4-10/10 Rt sided pain that is fairly constant, sharp/stabbing and disrupts his sleep.  Pt with limited lumbar A/ROM and  hip  flexibility with Rt sided LBP with all mobility.  Reduced PA mobility T9-L5  with pain most prominent T11-L3 with PA mobs.  Palpable tension with palpation over Rt quadratus, lats and lumbar paraspinals. Diastasis recti noted with rolling in bed.   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 Patient will benefit from skilled PT to address the below impairments and improve overall function.     OBJECTIVE IMPAIRMENTS: decreased ROM, decreased strength, hypomobility, increased fascial restrictions, increased muscle spasms, impaired flexibility, improper body mechanics, postural dysfunction, and pain.   ACTIVITY LIMITATIONS: carrying, sitting, standing, squatting, sleeping, transfers, bed mobility, and locomotion level  PARTICIPATION LIMITATIONS: meal prep, cleaning, driving, shopping, community activity, and occupation  PERSONAL FACTORS: Profession are also affecting patient's functional outcome.   REHAB POTENTIAL: Good  CLINICAL DECISION MAKING: Stable/uncomplicated  EVALUATION COMPLEXITY: Low   GOALS: Goals reviewed with patient? Yes  SHORT TERM GOALS: Target date: 03/04/2024    Be independent in initial HEP Baseline: Goal status: INITIAL  2.  Report waking < or = to 2x/night with pain  Baseline: 2-3x/night  Goal status: INITIAL  3.  Report < or = to 7/10 max low back pain with work and home tasks Baseline: up to 10/10 Goal status: INITIAL  4.  Verbalize and demonstrate core activation and body mechanics modifications for lumbar protection with daily tasks  Baseline:  Goal status: INITIAL   LONG TERM GOALS: Target date: 04/01/2024    Be independent in advanced HEP Baseline:  Goal status: INITIAL  2.  Improve Modified Oswestry to < or = to 10% disability (5/50) Baseline: 10/50  Goal status: INITIAL  3.  Report sleeping through the night without interruption > or = to 5x/wk Baseline: waking 2-3x/night  Goal status: INITIAL  4.   Report > or = to 70% reduction in LBP with work and daily tasks  Baseline: 6-10/10 Goal status: INITIAL    PLAN:  PT FREQUENCY: 2x/week  PT DURATION: 8 weeks  PLANNED INTERVENTIONS: 97110-Therapeutic exercises, 97530- Therapeutic activity, 97112- Neuromuscular re-education, 97535- Self Care, 02859- Manual therapy, [phone]- Canalith repositioning, [id number]- Aquatic Therapy, [id number]- Electrical stimulation (unattended), [phone]- Electrical stimulation (manual), 20560 (1-2 muscles), 20561 (3+ muscles)- Dry Needling, Patient/Family education, Taping, Joint mobilization, Joint manipulation, Spinal manipulation, Spinal mobilization, Vestibular training, Cryotherapy, and Moist heat.  PLAN FOR NEXT SESSION: Dry needling to Rt lumbar/QL, multifidi (need to issue handout, he is aware of cost), review HEP, core strength, work on [employer], PT 02/05/2024 1:03 PM  "

## 2024-02-05 NOTE — Progress Notes (Deleted)
 " OUTPATIENT PHYSICAL THERAPY THORACOLUMBAR EVALUATION   Patient Name: Rodney Burgess MRN: [id number] DOB:06-Sep-1960, 64 y.o., male Today's Date: 02/05/2024  END OF SESSION:  PT End of Session - 02/05/24 1100     Visit Number 1    Date for Recertification  04/01/24    Authorization Type [employer]- submitted for auth    PT Start Time 1014    PT Stop Time 1100    PT Time Calculation (min) 46 min    Activity Tolerance Patient tolerated treatment well    Behavior During Therapy WFL for tasks assessed/performed          Past Medical History:  Diagnosis Date   Acute bronchitis    Contact dermatitis and other eczema due to plants (except food)    Internal hemorrhoids    Localized superficial swelling, mass, or lump    Pain in joint, shoulder region    Personal history of colonic polyp - right sided hyperplastic (serrated) 07/09/2013   Prolapsed internal hemorrhoids 09/20/2018   Gr 2 internals and some external components   Syncope and collapse    Past Surgical History:  Procedure Laterality Date   COLONOSCOPY     HEMORRHOID BANDING  08/2018   UMBILICAL HERNIA REPAIR     Patient Active Problem List   Diagnosis Date Noted   Witnessed apneic spells 04/09/2023   Other fatigue 04/09/2023   Snoring 04/09/2023   Vitamin D  deficiency 06/18/2019   Vitamin B12 deficiency 06/18/2019   DM (diabetes mellitus), type 2 (HCC) 06/18/2019   Prolapsed internal hemorrhoids 09/20/2018   History of colonic polyps 07/09/2013   Hyperlipidemia associated with type 2 diabetes mellitus (HCC) 07/26/2011    PCP: Theophilus Andrews, Tully, MD  REFERRING PROVIDER: Theophilus Andrews, Tully, MD  REFERRING DIAG:  Diagnosis  M54.50 (ICD-10-CM) - Acute right-sided low back pain without sciatica    Rationale for Evaluation and Treatment: Rehabilitation  THERAPY DIAG:  Other low back pain - Plan: PT plan of care cert/re-cert  Muscle weakness (generalized) - Plan: PT plan of care cert/re-cert  Cramp  and spasm - Plan: PT plan of care cert/re-cert  ONSET DATE: 01/11/24  SUBJECTIVE:                                                                                                                                                                                           SUBJECTIVE STATEMENT: Pt presents to PT with LBP and bil LE pain that began 01/11/24 and he was walking.  No incident or injury.  No imaging has been done.    From MD on 01/14/24:  Rodney Burgess is a 64 y.o.  male who is coming in today for the above mentioned reasons.  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  He went to the emergency department and was given Toradol  and Robaxin  that has had very minimal relief.  No sciatica.  Hurts with twisting movements of torso.   PERTINENT HISTORY:  None   PAIN: 02/05/24 Are you having pain? Yes: NPRS scale: 6/10 now, 4-10/10 Pain location: Rt low back and bil legs  Pain description: stabbing, sharp Aggravating factors: constant, movement, both standing and sitting , sleep (waking 3-4x/night) Relieving factors: TENs unit, heat  PRECAUTIONS: None  RED FLAGS: None   WEIGHT BEARING RESTRICTIONS: No  FALLS:  Has patient fallen in last 6 months? No  LIVING ENVIRONMENT: Lives with: lives with their family Lives in: House/apartment   OCCUPATION: works as estate agent  PLOF: Independent  PATIENT GOALS: reduce back pain, work without pain, sleep without interruption   NEXT MD VISIT: none   OBJECTIVE:  Note: Objective measures were completed at Evaluation unless otherwise noted.  DIAGNOSTIC FINDINGS:  None   PATIENT SURVEYS:  02/05/24: Modified Oswestry:10/50=20% disability    COGNITION: Overall cognitive status: Within functional limits for tasks assessed     SENSATION: WFL  MUSCLE LENGTH: Bil hip flexibility limited by 25% with pain on Rt into Rt low back and quadratus  POSTURE: rounded shoulders, forward head, flexed trunk , and  diastasis recti with rolling   PALPATION: Marked palpable tenderness   LUMBAR ROM:   AROM eval  Flexion Limited by 25% with Rt sided pain  Extension WFLs with Rt lumbar pain  Right lateral flexion Limited by 25% with Rt back pain  Left lateral flexion Full with pain on Rt  Right rotation   Left rotation    (Blank rows = not tested)  LOWER EXTREMITY ROM:    Hip flexibility limited by 25% bil   LOWER EXTREMITY MMT:   5/5 bil LE strength with Rt sided pain with Rt LE testing  LUMBAR SPECIAL TESTS:  Slump test: Negative   GAIT: Distance walked: 50 feet  Assistive device utilized: None Level of assistance: Complete Independence Comments: WFLs   TREATMENT DATE:  02/05/24:  Findings from evaluation discussed, pt educated on plan of care, HEP initiated.      PATIENT EDUCATION:  Education details: Access Code: [email or web address], log roll, brief education regarding dry needling (didn't provide handout) Person educated: Patient Education method: Explanation, Demonstration, and Handouts Education comprehension: verbalized understanding and returned demonstration  HOME EXERCISE PROGRAM: Access Code: [email or web address] URL: [email or web address] Date: 02/05/2024 Prepared by: Burnard  Exercises - Supine Lower Trunk Rotation  - 2 x daily - 7 x weekly - 1 sets - 3 reps - 20 hold - Seated Hamstring Stretch  - 2-3 x daily - 7 x weekly - 1 sets - 3 reps - 20 hold - Child's Pose Stretch  - 2 x daily - 7 x weekly - 1 sets - 3-5 reps - 20 hold - Doorway Hip Flexor Stretch with Chair  - 3 x daily - 7 x weekly - 1 sets - 3 reps - 20 hold  ASSESSMENT:  CLINICAL IMPRESSION: Patient is a 64 y.o. male who was seen today for physical therapy evaluation and treatment for Rt sided LBP and LE pain.  This pain began ~3 weeks ago without cause and denies imaging.   Pt reports 4-10/10 Rt sided pain that is fairly constant, sharp/stabbing and disrupts his sleep.  Pt with limited lumbar A/ROM and  hip  flexibility with Rt sided LBP with all mobility.  Reduced PA mobility T9-L5  with pain most prominent T11-L3 with PA mobs.  Palpable tension with palpation over Rt quadratus, lats and lumbar paraspinals. Diastasis recti noted with rolling in bed.   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 Patient will benefit from skilled PT to address the below impairments and improve overall function.     OBJECTIVE IMPAIRMENTS: decreased ROM, decreased strength, hypomobility, increased fascial restrictions, increased muscle spasms, impaired flexibility, improper body mechanics, postural dysfunction, and pain.   ACTIVITY LIMITATIONS: carrying, sitting, standing, squatting, sleeping, transfers, bed mobility, and locomotion level  PARTICIPATION LIMITATIONS: meal prep, cleaning, driving, shopping, community activity, and occupation  PERSONAL FACTORS: Profession are also affecting patient's functional outcome.   REHAB POTENTIAL: Good  CLINICAL DECISION MAKING: Stable/uncomplicated  EVALUATION COMPLEXITY: Low   GOALS: Goals reviewed with patient? Yes  SHORT TERM GOALS: Target date: 03/04/2024    Be independent in initial HEP Baseline: Goal status: INITIAL  2.  Report waking < or = to 2x/night with pain  Baseline: 2-3x/night  Goal status: INITIAL  3.  Report < or = to 7/10 max low back pain with work and home tasks Baseline: up to 10/10 Goal status: INITIAL  4.  Verbalize and demonstrate core activation and body mechanics modifications for lumbar protection with daily tasks  Baseline:  Goal status: INITIAL   LONG TERM GOALS: Target date: 04/01/2024    Be independent in advanced HEP Baseline:  Goal status: INITIAL  2.  Improve Modified Oswestry to < or = to 10% disability (5/50) Baseline: 10/50  Goal status: INITIAL  3.  Report sleeping through the night without interruption > or = to 5x/wk Baseline: waking 2-3x/night  Goal status: INITIAL  4.   Report > or = to 70% reduction in LBP with work and daily tasks  Baseline: 6-10/10 Goal status: INITIAL    PLAN:  PT FREQUENCY: 2x/week  PT DURATION: 8 weeks  PLANNED INTERVENTIONS: 97110-Therapeutic exercises, 97530- Therapeutic activity, 97112- Neuromuscular re-education, 97535- Self Care, 02859- Manual therapy, [phone]- Canalith repositioning, [id number]- Aquatic Therapy, [id number]- Electrical stimulation (unattended), [phone]- Electrical stimulation (manual), 20560 (1-2 muscles), 20561 (3+ muscles)- Dry Needling, Patient/Family education, Taping, Joint mobilization, Joint manipulation, Spinal manipulation, Spinal mobilization, Vestibular training, Cryotherapy, and Moist heat.  PLAN FOR NEXT SESSION: Dry needling to Rt lumbar/QL, multifidi (need to issue handout, he is aware of cost), review HEP, core strength, work on [employer], PT 02/05/2024 1:03 PM  "

## 2024-02-06 ENCOUNTER — Ambulatory Visit

## 2024-02-06 DIAGNOSIS — R252 Cramp and spasm: Secondary | ICD-10-CM

## 2024-02-06 DIAGNOSIS — M5459 Other low back pain: Secondary | ICD-10-CM

## 2024-02-06 DIAGNOSIS — M6281 Muscle weakness (generalized): Secondary | ICD-10-CM

## 2024-02-06 NOTE — Therapy (Addendum)
 OUTPATIENT PHYSICAL THERAPY TREATMENT     Patient Name: Rodney Burgess MRN: [id number] DOB:1960/05/06, 64 y.o., male Today's Date: 02/05/2024   END OF SESSION:  PT End of Session - 02/06/24 0839     Visit Number 2    Date for Recertification  04/01/24    Authorization Type BCBS- submitted for auth    PT Start Time 0800    PT Stop Time 0839    PT Time Calculation (min) 39 min    Activity Tolerance Patient tolerated treatment well    Behavior During Therapy [facility] for tasks assessed/performed                 Past Medical History:  Diagnosis Date   Acute bronchitis     Contact dermatitis and other eczema due to plants (except food)     Internal hemorrhoids     Localized superficial swelling, mass, or lump     Pain in joint, shoulder region     Personal history of colonic polyp - right sided hyperplastic (serrated) 07/09/2013   Prolapsed internal hemorrhoids 09/20/2018    Gr 2 internals and some external components   Syncope and collapse    [] Expand by Default           Past Surgical History:  Procedure Laterality Date   COLONOSCOPY       HEMORRHOID BANDING   08/2018   UMBILICAL HERNIA REPAIR                Patient Active Problem List    Diagnosis Date Noted   Witnessed apneic spells 04/09/2023   Other fatigue 04/09/2023   Snoring 04/09/2023   Vitamin D  deficiency 06/18/2019   Vitamin B12 deficiency 06/18/2019   DM (diabetes mellitus), type 2 (HCC) 06/18/2019   Prolapsed internal hemorrhoids 09/20/2018   History of colonic polyps 07/09/2013   Hyperlipidemia associated with type 2 diabetes mellitus (HCC) 07/26/2011      PCP: Theophilus Andrews, Tully, MD   REFERRING PROVIDER: Theophilus Andrews, Tully, MD   REFERRING DIAG:  Diagnosis  M54.50 (ICD-10-CM) - Acute right-sided low back pain without sciatica      Rationale for Evaluation and Treatment: Rehabilitation   THERAPY DIAG:  Other low back pain - Plan: PT plan of care cert/re-cert   Muscle weakness  (generalized) - Plan: PT plan of care cert/re-cert   Cramp and spasm - Plan: PT plan of care cert/re-cert   ONSET DATE: 01/11/24   SUBJECTIVE:  SUBJECTIVE STATEMENT: I did my stretches and bought a massage gun.    From MD on 01/14/24:  Rodney Burgess is a 64 y.o. male who is coming in today for the above mentioned reasons.  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  He went to the emergency department and was given Toradol  and Robaxin  that has had very minimal relief.  No sciatica.  Hurts with twisting movements of torso.    PERTINENT HISTORY:  None    PAIN: 02/06/24 Are you having pain? Yes: NPRS scale: 9/10 Pain location: Rt low back and bil legs  Pain description: stabbing, sharp Aggravating factors: constant, movement, both standing and sitting , sleep (waking 3-4x/night) Relieving factors: TENs unit, heat   PRECAUTIONS: None   RED FLAGS: None      WEIGHT BEARING RESTRICTIONS: No   FALLS:  Has patient fallen in last 6 months? No   LIVING ENVIRONMENT: Lives with: lives with their family Lives in: House/apartment     OCCUPATION: works as estate agent   PLOF: Independent   PATIENT GOALS: reduce back pain, work without pain, sleep without interruption    NEXT MD VISIT: none    OBJECTIVE:  Note: Objective measures were completed at Evaluation unless otherwise noted.   DIAGNOSTIC FINDINGS:  None    PATIENT SURVEYS:  02/05/24: Modified Oswestry:10/50=20% disability     COGNITION: Overall cognitive status: Within functional limits for tasks assessed                          SENSATION: WFL   MUSCLE LENGTH: Bil hip flexibility limited by 25% with pain on Rt into Rt low back and quadratus   POSTURE: rounded shoulders, forward head, flexed trunk , and  diastasis recti with rolling    PALPATION: Marked palpable tenderness    LUMBAR ROM:    AROM eval  Flexion Limited by 25% with Rt sided pain  Extension WFLs with Rt lumbar pain  Right lateral flexion Limited by 25% with Rt back pain  Left lateral flexion Full with pain on Rt  Right rotation    Left rotation     (Blank rows = not tested)   LOWER EXTREMITY ROM:    Hip flexibility limited by 25% bil    LOWER EXTREMITY MMT:   5/5 bil LE strength with Rt sided pain with Rt LE testing   LUMBAR SPECIAL TESTS:  Slump test: Negative     GAIT: Distance walked: 50 feet  Assistive device utilized: None Level of assistance: Complete Independence Comments: WFLs    TREATMENT DATE:  02/05/24:  Seated hamstring stretch 2x20 seconds  Couter top stretch forward and lateral to the Lt 2x20 seconds  Hip flexor lateral stretch to stretch QL 3x20 seconds  Low trunk rotation 3x20 seconds  Trigger Point Dry Needling  Initial Treatment: Pt instructed on Dry Needling rational, procedures, and possible side effects. Pt instructed to expect mild to moderate muscle soreness later in the day and/or into the next day.  Pt instructed in methods to reduce muscle soreness. Pt instructed to continue prescribed HEP. Because Dry Needling was performed over or adjacent to a lung field, pt was educated on S/S of pneumothorax and to seek immediate medical attention should they occur.  Patient was educated on signs and symptoms of infection and other risk factors and advised to seek medical attention should they occur.  Patient verbalized understanding of these instructions and education.   Patient  Verbal Consent Given: Yes Education Handout Provided: Yes Muscles Treated: bil lower thoracic and lumbar multifidi, Rt lats, Rt QL, Rt lumbar paraspinals  Electrical Stimulation Performed: No Treatment Response/Outcome: Utilized skilled palpation to identify bony landmarks and trigger points.  Able to illicit  twitch response and muscle elongation.  Soft tissue mobilization to muscles needled following DN to further promote tissue elongation and decreased pain.    PA mobs to T9-L4 grade 2-3   02/05/24:  Findings from evaluation discussed, pt educated on plan of care, HEP initiated.                             PATIENT EDUCATION:  Education details: Access Code: [email or web address], log roll, brief education regarding dry needling (didn't provide handout) Person educated: Patient Education method: Explanation, Demonstration, and Handouts Education comprehension: verbalized understanding and returned demonstration   HOME EXERCISE PROGRAM: Access Code: [email or web address] URL: [email or web address] Date: 02/05/2024 Prepared by: Burnard   Exercises - Supine Lower Trunk Rotation  - 2 x daily - 7 x weekly - 1 sets - 3 reps - 20 hold - Seated Hamstring Stretch  - 2-3 x daily - 7 x weekly - 1 sets - 3 reps - 20 hold - Child's Pose Stretch  - 2 x daily - 7 x weekly - 1 sets - 3-5 reps - 20 hold - Doorway Hip Flexor Stretch with Chair  - 3 x daily - 7 x weekly - 1 sets - 3 reps - 20 hold   ASSESSMENT:   CLINICAL IMPRESSION: First time follow-up after evaluation yesterday.  He has been doing his stretches and ordered a massage gun.  Pt arrived with 7/10 pain and reported 50% less pain after manual therapy and dry needling today.  PT provided cueing for him to reduce force with is stretching and to breathe for rib expansion.  Good response to dry needling with improved tissue mobility and twitch response.  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 Patient will benefit from skilled PT to address the below impairments and improve overall function.       OBJECTIVE IMPAIRMENTS: decreased ROM, decreased strength, hypomobility, increased fascial restrictions, increased muscle spasms, impaired flexibility, improper body mechanics, postural dysfunction, and pain.    ACTIVITY  LIMITATIONS: carrying, sitting, standing, squatting, sleeping, transfers, bed mobility, and locomotion level   PARTICIPATION LIMITATIONS: meal prep, cleaning, driving, shopping, community activity, and occupation   PERSONAL FACTORS: Profession are also affecting patient's functional outcome.    REHAB POTENTIAL: Good   CLINICAL DECISION MAKING: Stable/uncomplicated   EVALUATION COMPLEXITY: Low     GOALS: Goals reviewed with patient? Yes   SHORT TERM GOALS: Target date: 03/04/2024     Be independent in initial HEP Baseline: Goal status: INITIAL   2.  Report waking < or = to 2x/night with pain  Baseline: 2-3x/night  Goal status: INITIAL   3.  Report < or = to 7/10 max low back pain with work and home tasks Baseline: up to 10/10 Goal status: INITIAL   4.  Verbalize and demonstrate core activation and body mechanics modifications for lumbar protection with daily tasks  Baseline:  Goal status: INITIAL     LONG TERM GOALS: Target date: 04/01/2024     Be independent in advanced HEP Baseline:  Goal status: INITIAL   2.  Improve Modified Oswestry to < or = to 10% disability (5/50) Baseline: 10/50  Goal status: INITIAL   3.  Report sleeping through the night without interruption > or = to 5x/wk Baseline: waking 2-3x/night  Goal status: INITIAL   4.  Report > or = to 70% reduction in LBP with work and daily tasks  Baseline: 6-10/10 Goal status: INITIAL       PLAN:   PT FREQUENCY: 2x/week   PT DURATION: 8 weeks   PLANNED INTERVENTIONS: 97110-Therapeutic exercises, 97530- Therapeutic activity, 97112- Neuromuscular re-education, 97535- Self Care, 02859- Manual therapy, [phone]- Canalith repositioning, [id number]- Aquatic Therapy, G0283- Electrical stimulation (unattended), [phone]- Electrical stimulation (manual), 20560 (1-2 muscles), 20561 (3+ muscles)- Dry Needling, Patient/Family education, Taping, Joint mobilization, Joint manipulation, Spinal manipulation, Spinal mobilization,  Vestibular training, Cryotherapy, and Moist heat.   PLAN FOR NEXT SESSION: assess response to dry needling and repeat if helpful, initiate core and scapular strength, work on log roll.    Burnard Joy, PT 02/06/2024 11:02 AM   [facility] [address] [place], [place] [address] Phone # [phone] Fax [phone]

## 2024-02-06 NOTE — Patient Instructions (Signed)

## 2024-02-07 ENCOUNTER — Ambulatory Visit: Admitting: Physical Therapy

## 2024-02-11 ENCOUNTER — Ambulatory Visit: Admitting: Physical Therapy

## 2024-02-11 ENCOUNTER — Encounter: Payer: Self-pay | Admitting: Physical Therapy

## 2024-02-11 DIAGNOSIS — M5459 Other low back pain: Secondary | ICD-10-CM | POA: Diagnosis not present

## 2024-02-11 DIAGNOSIS — M6281 Muscle weakness (generalized): Secondary | ICD-10-CM

## 2024-02-11 DIAGNOSIS — R252 Cramp and spasm: Secondary | ICD-10-CM

## 2024-02-11 NOTE — Therapy (Addendum)
 OUTPATIENT PHYSICAL THERAPY TREATMENT     Patient Name: Rodney Burgess MRN: [id number] DOB:09-Sep-1960, 64 y.o., male Today's Date: 02/05/2024   END OF SESSION:  PT End of Session - 02/11/24 0853     Visit Number 3    Date for Recertification  04/01/24    Authorization Type BCBS- [place] Approved (5 Visits) AUTH#: [id number] Gemma. dates: 02/05/2024-04/04/2024    Authorization - Visit Number 3    Authorization - Number of Visits 5    PT Start Time 0803    PT Stop Time 0842    PT Time Calculation (min) 39 min    Activity Tolerance Patient tolerated treatment well    Behavior During Therapy WFL for tasks assessed/performed                  Past Medical History:  Diagnosis Date   Acute bronchitis     Contact dermatitis and other eczema due to plants (except food)     Internal hemorrhoids     Localized superficial swelling, mass, or lump     Pain in joint, shoulder region     Personal history of colonic polyp - right sided hyperplastic (serrated) 07/09/2013   Prolapsed internal hemorrhoids 09/20/2018    Gr 2 internals and some external components   Syncope and collapse    [] Expand by Default           Past Surgical History:  Procedure Laterality Date   COLONOSCOPY       HEMORRHOID BANDING   08/2018   UMBILICAL HERNIA REPAIR                Patient Active Problem List    Diagnosis Date Noted   Witnessed apneic spells 04/09/2023   Other fatigue 04/09/2023   Snoring 04/09/2023   Vitamin D  deficiency 06/18/2019   Vitamin B12 deficiency 06/18/2019   DM (diabetes mellitus), type 2 (HCC) 06/18/2019   Prolapsed internal hemorrhoids 09/20/2018   History of colonic polyps 07/09/2013   Hyperlipidemia associated with type 2 diabetes mellitus (HCC) 07/26/2011      PCP: Theophilus Andrews, Tully, MD   REFERRING PROVIDER: Theophilus Andrews, Tully, MD   REFERRING DIAG:  Diagnosis  M54.50 (ICD-10-CM) - Acute right-sided low back pain without sciatica      Rationale for  Evaluation and Treatment: Rehabilitation   THERAPY DIAG:  Other low back pain - Plan: PT plan of care cert/re-cert   Muscle weakness (generalized) - Plan: PT plan of care cert/re-cert   Cramp and spasm - Plan: PT plan of care cert/re-cert   ONSET DATE: 01/11/24   SUBJECTIVE:  SUBJECTIVE STATEMENT: Patient reports he did really good with dry needling. He still has pain but it is better. He felt relief for 2-3 days.   From MD on 01/14/24:  Rodney Burgess is a 64 y.o. male who is coming in today for the above mentioned reasons.  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  He went to the emergency department and was given Toradol  and Robaxin  that has had very minimal relief.  No sciatica.  Hurts with twisting movements of torso.    PERTINENT HISTORY:  None    PAIN: 02/06/24 Are you having pain? Yes: NPRS scale: 9/10 Pain location: Rt low back and bil legs  Pain description: stabbing, sharp Aggravating factors: constant, movement, both standing and sitting , sleep (waking 3-4x/night) Relieving factors: TENs unit, heat   PRECAUTIONS: None   RED FLAGS: None      WEIGHT BEARING RESTRICTIONS: No   FALLS:  Has patient fallen in last 6 months? No   LIVING ENVIRONMENT: Lives with: lives with their family Lives in: House/apartment     OCCUPATION: works as estate agent   PLOF: Independent   PATIENT GOALS: reduce back pain, work without pain, sleep without interruption    NEXT MD VISIT: none    OBJECTIVE:  Note: Objective measures were completed at Evaluation unless otherwise noted.   DIAGNOSTIC FINDINGS:  None    PATIENT SURVEYS:  02/05/24: Modified Oswestry:10/50=20% disability     COGNITION: Overall cognitive status: Within functional limits for tasks assessed                           SENSATION: WFL   MUSCLE LENGTH: Bil hip flexibility limited by 25% with pain on Rt into Rt low back and quadratus   POSTURE: rounded shoulders, forward head, flexed trunk , and diastasis recti with rolling    PALPATION: Marked palpable tenderness    LUMBAR ROM:    AROM eval  Flexion Limited by 25% with Rt sided pain  Extension WFLs with Rt lumbar pain  Right lateral flexion Limited by 25% with Rt back pain  Left lateral flexion Full with pain on Rt  Right rotation    Left rotation     (Blank rows = not tested)   LOWER EXTREMITY ROM:    Hip flexibility limited by 25% bil    LOWER EXTREMITY MMT:   5/5 bil LE strength with Rt sided pain with Rt LE testing   LUMBAR SPECIAL TESTS:  Slump test: Negative     GAIT: Distance walked: 50 feet  Assistive device utilized: None Level of assistance: Complete Independence Comments: WFLs    TREATMENT DATE:  02/11/2024 Trigger Point Dry Needling  Subsequent Treatment: Instructions provided previously at initial dry needling treatment.   Patient Verbal Consent Given: Yes Education Handout Provided: Previously Provided Muscles Treated: bil lower lumbar multifidi, Rt QL, Rt lumbar paraspinals  Electrical Stimulation Performed: No Treatment Response/Outcome: Utilized skilled palpation to identify trigger points.  During dry needling able to palpate muscle twitch and muscle elongation  Soft tissue mobilization performed to further promote tissue elongation and decreased pain.       LTR x 10 each side 5 sec hold Doorway QL stretch x 20 sec switching Shoulder row and extension with blue TB x 10 each Standing pallof press with blue TB x 12 each direction Standing ER with blue TB x 10   02/05/24:  Seated hamstring stretch 2x20 seconds  Couter  top stretch forward and lateral to the Lt 2x20 seconds  Hip flexor lateral stretch to stretch QL 3x20 seconds  Low trunk rotation 3x20 seconds  Trigger Point Dry  Needling  Initial Treatment: Pt instructed on Dry Needling rational, procedures, and possible side effects. Pt instructed to expect mild to moderate muscle soreness later in the day and/or into the next day.  Pt instructed in methods to reduce muscle soreness. Pt instructed to continue prescribed HEP. Because Dry Needling was performed over or adjacent to a lung field, pt was educated on S/S of pneumothorax and to seek immediate medical attention should they occur.  Patient was educated on signs and symptoms of infection and other risk factors and advised to seek medical attention should they occur.  Patient verbalized understanding of these instructions and education.   Patient Verbal Consent Given: Yes Education Handout Provided: Yes Muscles Treated: bil lower thoracic and lumbar multifidi, Rt lats, Rt QL, Rt lumbar paraspinals  Electrical Stimulation Performed: No Treatment Response/Outcome: Utilized skilled palpation to identify bony landmarks and trigger points.  Able to illicit twitch response and muscle elongation.  Soft tissue mobilization to muscles needled following DN to further promote tissue elongation and decreased pain.    PA mobs to T9-L4 grade 2-3   02/05/24:  Findings from evaluation discussed, pt educated on plan of care, HEP initiated.                             PATIENT EDUCATION:  Education details: Access Code: [email or web address], log roll, brief education regarding dry needling (didn't provide handout) Person educated: Patient Education method: Explanation, Demonstration, and Handouts Education comprehension: verbalized understanding and returned demonstration   HOME EXERCISE PROGRAM: Access Code: [email or web address] URL: [email or web address] Date: 02/11/2024 Prepared by: Kristeen Sar  Exercises - Supine Lower Trunk Rotation  - 2 x daily - 7 x weekly - 1 sets - 3 reps - 20 hold - Seated Hamstring Stretch  - 2-3 x daily - 7 x weekly - 1 sets - 3 reps - 20 hold -  Child's Pose Stretch  - 2 x daily - 7 x weekly - 1 sets - 3-5 reps - 20 hold - Doorway Hip Flexor Stretch with Chair  - 3 x daily - 7 x weekly - 1 sets - 3 reps - 20 hold - Standing Shoulder Row with Anchored Resistance  - 1 x daily - 7 x weekly - 1-2 sets - 10 reps - Shoulder extension with resistance - Neutral  - 1 x daily - 7 x weekly - 1-2 sets - 10 reps - Standing Anti-Rotation Press with Anchored Resistance  - 1 x daily - 7 x weekly - 1-2 sets - 10 reps - Shoulder External Rotation and Scapular Retraction with Resistance  - 1 x daily - 7 x weekly - 1-2 sets - 10 reps   ASSESSMENT:   CLINICAL IMPRESSION: Sten responded very well to dry needling last session. He verbalized relief for 2-3 days. We did dry needling again today and palpable muscle twitch and elongation was felt for targeted muscles. Patient verbalized relief after dry needling. Incorporated core and postural strengthening today. PT provided verbal and visual cues for correct exercise performance. Updated HEP to include exercise progressions. Patient should continue to respond well to skilled therapy.     OBJECTIVE IMPAIRMENTS: decreased ROM, decreased strength, hypomobility, increased fascial restrictions, increased muscle spasms, impaired flexibility, improper body mechanics, postural dysfunction, and pain.  ACTIVITY LIMITATIONS: carrying, sitting, standing, squatting, sleeping, transfers, bed mobility, and locomotion level   PARTICIPATION LIMITATIONS: meal prep, cleaning, driving, shopping, community activity, and occupation   PERSONAL FACTORS: Profession are also affecting patient's functional outcome.    REHAB POTENTIAL: Good   CLINICAL DECISION MAKING: Stable/uncomplicated   EVALUATION COMPLEXITY: Low     GOALS: Goals reviewed with patient? Yes   SHORT TERM GOALS: Target date: 03/04/2024     Be independent in initial HEP Baseline: Goal status: INITIAL   2.  Report waking < or = to 2x/night with pain   Baseline: 2-3x/night  Goal status: INITIAL   3.  Report < or = to 7/10 max low back pain with work and home tasks Baseline: up to 10/10 Goal status: INITIAL   4.  Verbalize and demonstrate core activation and body mechanics modifications for lumbar protection with daily tasks  Baseline:  Goal status: INITIAL     LONG TERM GOALS: Target date: 04/01/2024     Be independent in advanced HEP Baseline:  Goal status: INITIAL   2.  Improve Modified Oswestry to < or = to 10% disability (5/50) Baseline: 10/50  Goal status: INITIAL   3.  Report sleeping through the night without interruption > or = to 5x/wk Baseline: waking 2-3x/night  Goal status: INITIAL   4.  Report > or = to 70% reduction in LBP with work and daily tasks  Baseline: 6-10/10 Goal status: INITIAL       PLAN:   PT FREQUENCY: 2x/week   PT DURATION: 8 weeks   PLANNED INTERVENTIONS: 97110-Therapeutic exercises, 97530- Therapeutic activity, 97112- Neuromuscular re-education, 97535- Self Care, 02859- Manual therapy, [phone]- Canalith repositioning, [id number]- Aquatic Therapy, G0283- Electrical stimulation (unattended), [phone]- Electrical stimulation (manual), 20560 (1-2 muscles), 20561 (3+ muscles)- Dry Needling, Patient/Family education, Taping, Joint mobilization, Joint manipulation, Spinal manipulation, Spinal mobilization, Vestibular training, Cryotherapy, and Moist heat.   PLAN FOR NEXT SESSION: submit for more auth; assess response to dry needling and updated HEP; continue core and scapular strength, work on log roll.     Kristeen Sar, PT, DPT 02/11/2024 9:06 AM [facility] [address] [place], [place] [address] Phone # [phone] Fax [phone]

## 2024-02-14 ENCOUNTER — Encounter: Payer: Self-pay | Admitting: Physical Therapy

## 2024-02-14 ENCOUNTER — Ambulatory Visit: Admitting: Physical Therapy

## 2024-02-14 DIAGNOSIS — M5459 Other low back pain: Secondary | ICD-10-CM

## 2024-02-14 DIAGNOSIS — R252 Cramp and spasm: Secondary | ICD-10-CM

## 2024-02-14 DIAGNOSIS — M6281 Muscle weakness (generalized): Secondary | ICD-10-CM

## 2024-02-14 NOTE — Therapy (Signed)
 OUTPATIENT PHYSICAL THERAPY TREATMENT     Patient Name: Rodney Burgess MRN: [id number] DOB:05-Feb-1960, 64 y.o., male Today's Date: 02/05/2024   END OF SESSION:  PT End of Session - 02/14/24 0753     Visit Number 4    Date for Recertification  04/01/24    Authorization Type [employer]- [place] Approved (5 Visits) AUTH#: [id number] Gemma. dates: 02/05/2024-04/04/2024    Authorization - Visit Number 4    Authorization - Number of Visits 5    PT Start Time 0754    PT Stop Time 0835    PT Time Calculation (min) 41 min    Activity Tolerance Patient tolerated treatment well                  Past Medical History:  Diagnosis Date   Acute bronchitis     Contact dermatitis and other eczema due to plants (except food)     Internal hemorrhoids     Localized superficial swelling, mass, or lump     Pain in joint, shoulder region     Personal history of colonic polyp - right sided hyperplastic (serrated) 07/09/2013   Prolapsed internal hemorrhoids 09/20/2018    Gr 2 internals and some external components   Syncope and collapse    [] Expand by Default           Past Surgical History:  Procedure Laterality Date   COLONOSCOPY       HEMORRHOID BANDING   08/2018   UMBILICAL HERNIA REPAIR                Patient Active Problem List    Diagnosis Date Noted   Witnessed apneic spells 04/09/2023   Other fatigue 04/09/2023   Snoring 04/09/2023   Vitamin D  deficiency 06/18/2019   Vitamin B12 deficiency 06/18/2019   DM (diabetes mellitus), type 2 (HCC) 06/18/2019   Prolapsed internal hemorrhoids 09/20/2018   History of colonic polyps 07/09/2013   Hyperlipidemia associated with type 2 diabetes mellitus (HCC) 07/26/2011      PCP: Theophilus Andrews, Tully, MD   REFERRING PROVIDER: Theophilus Andrews, Tully, MD   REFERRING DIAG:  Diagnosis  M54.50 (ICD-10-CM) - Acute right-sided low back pain without sciatica      Rationale for Evaluation and Treatment: Rehabilitation   THERAPY DIAG:   Other low back pain - Plan: PT plan of care cert/re-cert   Muscle weakness (generalized) - Plan: PT plan of care cert/re-cert   Cramp and spasm - Plan: PT plan of care cert/re-cert   ONSET DATE: 01/11/24   SUBJECTIVE:  SUBJECTIVE STATEMENT: Patient reports DN helped and slept good that night.  Pain returned last night.  Wants to do DN again today for relief.   From MD on 01/14/24:  Rodney Burgess is a 64 y.o. male who is coming in today for the above mentioned reasons.  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  He went to the emergency department and was given Toradol  and Robaxin  that has had very minimal relief.  No sciatica.  Hurts with twisting movements of torso.    PERTINENT HISTORY:  None    PAIN: 02/14/24 Are you having pain? Yes: NPRS scale: unable to give number bad/10 Pain location: Rt low back and bil legs  Pain description: stabbing, sharp Aggravating factors: constant, movement, both standing and sitting , sleep (waking 3-4x/night) Relieving factors: TENs unit, heat   PRECAUTIONS: None   RED FLAGS: None      WEIGHT BEARING RESTRICTIONS: No   FALLS:  Has patient fallen in last 6 months? No   LIVING ENVIRONMENT: Lives with: lives with their family Lives in: House/apartment     OCCUPATION: works as estate agent   PLOF: Independent   PATIENT GOALS: reduce back pain, work without pain, sleep without interruption    NEXT MD VISIT: none    OBJECTIVE:  Note: Objective measures were completed at Evaluation unless otherwise noted.   DIAGNOSTIC FINDINGS:  None    PATIENT SURVEYS:  02/05/24: Modified Oswestry:10/50=20% disability     COGNITION: Overall cognitive status: Within functional limits for tasks assessed                           SENSATION: WFL   MUSCLE LENGTH: Bil hip flexibility limited by 25% with pain on Rt into Rt low back and quadratus   POSTURE: rounded shoulders, forward head, flexed trunk , and diastasis recti with rolling    PALPATION: Marked palpable tenderness    LUMBAR ROM:    AROM eval  Flexion Limited by 25% with Rt sided pain  Extension WFLs with Rt lumbar pain  Right lateral flexion Limited by 25% with Rt back pain  Left lateral flexion Full with pain on Rt  Right rotation    Left rotation     (Blank rows = not tested)   LOWER EXTREMITY ROM:    Hip flexibility limited by 25% bil    LOWER EXTREMITY MMT:   5/5 bil LE strength with Rt sided pain with Rt LE testing   LUMBAR SPECIAL TESTS:  Slump test: Negative     GAIT: Distance walked: 50 feet  Assistive device utilized: None Level of assistance: Complete Independence Comments: WFLs    TREATMENT DATE:  02/14/2024 Trigger Point Dry Needling Subsequent Treatment: Instructions provided previously at initial dry needling treatment.  Patient Verbal Consent Given: Yes Education Handout Provided: Previously Provided Muscles Treated: Rt QL, Rt  upper gluteals  Electrical Stimulation Performed: No Treatment Response/Outcome: Utilized skilled palpation to identify trigger points.  During dry needling able to palpate muscle twitch and muscle elongation  Soft tissue mobilization performed to further promote tissue elongation and decreased pain.    Manual therapy: grade 3/4 neutral gapping; long axis hip distraction, inferior hip mobilization Open books 10x Seated thoracic extension with ball 10x  Quadruped foam roll outs 10x Quadruped thread the needle with foam roll 10x At the stairs 2nd step hip flexor and quadratus lumborum muscle lengthening 10x: Rocking forward and back with arm elevation Rocking  forward and back with arm reach up and over    02/11/2024 Trigger Point Dry Needling  Subsequent Treatment: Instructions provided  previously at initial dry needling treatment.   Patient Verbal Consent Given: Yes Education Handout Provided: Previously Provided Muscles Treated: bil lower lumbar multifidi, Rt QL, Rt lumbar paraspinals  Electrical Stimulation Performed: No Treatment Response/Outcome: Utilized skilled palpation to identify trigger points.  During dry needling able to palpate muscle twitch and muscle elongation  Soft tissue mobilization performed to further promote tissue elongation and decreased pain.       LTR x 10 each side 5 sec hold Doorway QL stretch x 20 sec switching Shoulder row and extension with blue TB x 10 each Standing pallof press with blue TB x 12 each direction Standing ER with blue TB x 10   02/05/24:  Seated hamstring stretch 2x20 seconds  Couter top stretch forward and lateral to the Lt 2x20 seconds  Hip flexor lateral stretch to stretch QL 3x20 seconds  Low trunk rotation 3x20 seconds  Trigger Point Dry Needling  Initial Treatment: Pt instructed on Dry Needling rational, procedures, and possible side effects. Pt instructed to expect mild to moderate muscle soreness later in the day and/or into the next day.  Pt instructed in methods to reduce muscle soreness. Pt instructed to continue prescribed HEP. Because Dry Needling was performed over or adjacent to a lung field, pt was educated on S/S of pneumothorax and to seek immediate medical attention should they occur.  Patient was educated on signs and symptoms of infection and other risk factors and advised to seek medical attention should they occur.  Patient verbalized understanding of these instructions and education.   Patient Verbal Consent Given: Yes Education Handout Provided: Yes Muscles Treated: bil lower thoracic and lumbar multifidi, Rt lats, Rt QL, Rt lumbar paraspinals  Electrical Stimulation Performed: No Treatment Response/Outcome: Utilized skilled palpation to identify bony landmarks and trigger points.  Able to  illicit twitch response and muscle elongation.  Soft tissue mobilization to muscles needled following DN to further promote tissue elongation and decreased pain.    PA mobs to T9-L4 grade 2-3   02/05/24:  Findings from evaluation discussed, pt educated on plan of care, HEP initiated.                             PATIENT EDUCATION:  Education details: Access Code: [email or web address], log roll, brief education regarding dry needling (didn't provide handout) Person educated: Patient Education method: Explanation, Demonstration, and Handouts Education comprehension: verbalized understanding and returned demonstration   HOME EXERCISE PROGRAM: Access Code: [email or web address] URL: [email or web address] Date: 02/11/2024 Prepared by: Kristeen Sar  Exercises - Supine Lower Trunk Rotation  - 2 x daily - 7 x weekly - 1 sets - 3 reps - 20 hold - Seated Hamstring Stretch  - 2-3 x daily - 7 x weekly - 1 sets - 3 reps - 20 hold - Child's Pose Stretch  - 2 x daily - 7 x weekly - 1 sets - 3-5 reps - 20 hold - Doorway Hip Flexor Stretch with Chair  - 3 x daily - 7 x weekly - 1 sets - 3 reps - 20 hold - Standing Shoulder Row with Anchored Resistance  - 1 x daily - 7 x weekly - 1-2 sets - 10 reps - Shoulder extension with resistance - Neutral  - 1 x daily - 7 x weekly - 1-2 sets - 10 reps -  Standing Anti-Rotation Press with Anchored Resistance  - 1 x daily - 7 x weekly - 1-2 sets - 10 reps - Shoulder External Rotation and Scapular Retraction with Resistance  - 1 x daily - 7 x weekly - 1-2 sets - 10 reps   ASSESSMENT:   CLINICAL IMPRESSION: The patient benefits significantly from dry needling and manual therapy to stimulate underlying myofascial trigger points and muscular tissue for management of neuromusculoskeletal pain and address movement impairments.  Much improved soft tissue mobility and decreased tender point size and number following treatment session.  The patient was encouraged in regular performance of  HEP post DN including soft tissue lengthening and strengthening exercises to enhance long term benefit.    OBJECTIVE IMPAIRMENTS: decreased ROM, decreased strength, hypomobility, increased fascial restrictions, increased muscle spasms, impaired flexibility, improper body mechanics, postural dysfunction, and pain.    ACTIVITY LIMITATIONS: carrying, sitting, standing, squatting, sleeping, transfers, bed mobility, and locomotion level   PARTICIPATION LIMITATIONS: meal prep, cleaning, driving, shopping, community activity, and occupation   PERSONAL FACTORS: Profession are also affecting patient's functional outcome.    REHAB POTENTIAL: Good   CLINICAL DECISION MAKING: Stable/uncomplicated   EVALUATION COMPLEXITY: Low     GOALS: Goals reviewed with patient? Yes   SHORT TERM GOALS: Target date: 03/04/2024     Be independent in initial HEP Baseline: Goal status: INITIAL   2.  Report waking < or = to 2x/night with pain  Baseline: 2-3x/night  Goal status: INITIAL   3.  Report < or = to 7/10 max low back pain with work and home tasks Baseline: up to 10/10 Goal status: INITIAL   4.  Verbalize and demonstrate core activation and body mechanics modifications for lumbar protection with daily tasks  Baseline:  Goal status: INITIAL     LONG TERM GOALS: Target date: 04/01/2024     Be independent in advanced HEP Baseline:  Goal status: INITIAL   2.  Improve Modified Oswestry to < or = to 10% disability (5/50) Baseline: 10/50  Goal status: INITIAL   3.  Report sleeping through the night without interruption > or = to 5x/wk Baseline: waking 2-3x/night  Goal status: INITIAL   4.  Report > or = to 70% reduction in LBP with work and daily tasks  Baseline: 6-10/10 Goal status: INITIAL       PLAN:   PT FREQUENCY: 2x/week   PT DURATION: 8 weeks   PLANNED INTERVENTIONS: 97110-Therapeutic exercises, 97530- Therapeutic activity, 97112- Neuromuscular re-education, 97535- Self Care,  97140- Manual therapy, [id number]- Canalith repositioning, 02886- Aquatic Therapy, G0283- Electrical stimulation (unattended), [phone]- Electrical stimulation (manual), 20560 (1-2 muscles), 20561 (3+ muscles)- Dry Needling, Patient/Family education, Taping, Joint mobilization, Joint manipulation, Spinal manipulation, Spinal mobilization, Vestibular training, Cryotherapy, and Moist heat.   PLAN FOR NEXT SESSION: submit for more auth; assess response to dry needling and updated HEP; continue core and scapular strength, work on log roll.    Glade Pesa, PT 02/14/24 8:44 AM Phone: [phone] Fax: [phone]   [facility] [address] [place], [place] [address] Phone # [phone] Fax [phone]

## 2024-02-19 ENCOUNTER — Ambulatory Visit

## 2024-02-19 DIAGNOSIS — M6281 Muscle weakness (generalized): Secondary | ICD-10-CM

## 2024-02-19 DIAGNOSIS — R252 Cramp and spasm: Secondary | ICD-10-CM

## 2024-02-19 DIAGNOSIS — M5459 Other low back pain: Secondary | ICD-10-CM | POA: Diagnosis not present

## 2024-02-19 NOTE — Therapy (Signed)
 OUTPATIENT PHYSICAL THERAPY TREATMENT     Patient Name: Rodney Burgess MRN: [id number] DOB:1960-10-28, 64 y.o., male Today's Date: 02/05/2024   END OF SESSION:  PT End of Session - 02/19/24 0901     Visit Number 4    Date for Recertification  04/01/24    Authorization Type [employer]- [place] Approved (5 Visits) AUTH#: [id number] Gemma. dates: 02/05/2024-04/04/2024    Authorization - Visit Number 5    Authorization - Number of Visits 5    PT Start Time 0847    PT Stop Time 0929    PT Time Calculation (min) 42 min    Activity Tolerance Patient tolerated treatment well    Behavior During Therapy WFL for tasks assessed/performed                   Past Medical History:  Diagnosis Date   Acute bronchitis     Contact dermatitis and other eczema due to plants (except food)     Internal hemorrhoids     Localized superficial swelling, mass, or lump     Pain in joint, shoulder region     Personal history of colonic polyp - right sided hyperplastic (serrated) 07/09/2013   Prolapsed internal hemorrhoids 09/20/2018    Gr 2 internals and some external components   Syncope and collapse    [] Expand by Default           Past Surgical History:  Procedure Laterality Date   COLONOSCOPY       HEMORRHOID BANDING   08/2018   UMBILICAL HERNIA REPAIR                Patient Active Problem List    Diagnosis Date Noted   Witnessed apneic spells 04/09/2023   Other fatigue 04/09/2023   Snoring 04/09/2023   Vitamin D  deficiency 06/18/2019   Vitamin B12 deficiency 06/18/2019   DM (diabetes mellitus), type 2 (HCC) 06/18/2019   Prolapsed internal hemorrhoids 09/20/2018   History of colonic polyps 07/09/2013   Hyperlipidemia associated with type 2 diabetes mellitus (HCC) 07/26/2011      PCP: Theophilus Andrews, Tully, MD   REFERRING PROVIDER: Theophilus Andrews, Tully, MD   REFERRING DIAG:  Diagnosis  M54.50 (ICD-10-CM) - Acute right-sided low back pain without sciatica      Rationale for  Evaluation and Treatment: Rehabilitation   THERAPY DIAG:  Other low back pain - Plan: PT plan of care cert/re-cert   Muscle weakness (generalized) - Plan: PT plan of care cert/re-cert   Cramp and spasm - Plan: PT plan of care cert/re-cert   ONSET DATE: 01/11/24   SUBJECTIVE:  SUBJECTIVE STATEMENT: I get relief from dry needling for 2-3 days and pain reduces to 3-4/10.  Today it is 7/10.  From MD on 01/14/24:  Rodney Burgess is a 64 y.o. male who is coming in today for the above mentioned reasons.  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  He went to the emergency department and was given Toradol  and Robaxin  that has had very minimal relief.  No sciatica.  Hurts with twisting movements of torso.    PERTINENT HISTORY:  None    PAIN: 02/19/24 Are you having pain? Yes: NPRS scale: 3-8/10 Pain location: Rt low back and bil legs  Pain description: stabbing, sharp Aggravating factors: constant, movement, both standing and sitting , sleep (waking 3-4x/night) Relieving factors: TENs unit, heat   PRECAUTIONS: None   RED FLAGS: None      WEIGHT BEARING RESTRICTIONS: No   FALLS:  Has patient fallen in last 6 months? No   LIVING ENVIRONMENT: Lives with: lives with their family Lives in: House/apartment     OCCUPATION: works as estate agent   PLOF: Independent   PATIENT GOALS: reduce back pain, work without pain, sleep without interruption    NEXT MD VISIT: none    OBJECTIVE:  Note: Objective measures were completed at Evaluation unless otherwise noted.   DIAGNOSTIC FINDINGS:  None    PATIENT SURVEYS:  02/05/24: Modified Oswestry:10/50=20% disability     COGNITION: Overall cognitive status: Within functional limits for tasks assessed                           SENSATION: WFL   MUSCLE LENGTH: Bil hip flexibility limited by 25% with pain on Rt into Rt low back and quadratus   POSTURE: rounded shoulders, forward head, flexed trunk , and diastasis recti with rolling    PALPATION: Marked palpable tenderness    LUMBAR ROM:    AROM eval  Flexion Limited by 25% with Rt sided pain  Extension WFLs with Rt lumbar pain  Right lateral flexion Limited by 25% with Rt back pain  Left lateral flexion Full with pain on Rt  Right rotation    Left rotation     (Blank rows = not tested)   LOWER EXTREMITY ROM:    Hip flexibility limited by 25% bil    LOWER EXTREMITY MMT:   5/5 bil LE strength with Rt sided pain with Rt LE testing   LUMBAR SPECIAL TESTS:  Slump test: Negative     GAIT: Distance walked: 50 feet  Assistive device utilized: None Level of assistance: Complete Independence Comments: WFLs    TREATMENT DATE:   02/14/2024 Trigger Point Dry Needling Subsequent Treatment: Instructions provided previously at initial dry needling treatment.  Patient Verbal Consent Given: Yes Education Handout Provided: Previously Provided Muscles Treated: Rt QL, Rt lumbar paraspinals, Rt thoracic multifidi T9-12, Rt lumbar multifidi, Rt lats at distal ribs Electrical Stimulation Performed: No Treatment Response/Outcome: Utilized skilled palpation to identify trigger points.  During dry needling able to palpate muscle twitch and muscle elongation  Soft tissue mobilization performed to further promote tissue elongation and decreased pain.    Manual therapy: grade 3/4 neutral gapping along ribs, PA mobs and rib mobs bil  Open books 10x Seated thoracic extension with ball 10x  Seated hamstring stretch 2x20 seconds bil Seated QL stretch over peanut ball 5x10 second hold   02/14/2024 Trigger Point Dry Needling Subsequent Treatment: Instructions provided previously at initial dry  needling treatment.  Patient Verbal Consent Given: Yes Education Handout  Provided: Previously Provided Muscles Treated: Rt QL, Rt  upper gluteals  Electrical Stimulation Performed: No Treatment Response/Outcome: Utilized skilled palpation to identify trigger points.  During dry needling able to palpate muscle twitch and muscle elongation  Soft tissue mobilization performed to further promote tissue elongation and decreased pain.    Manual therapy: grade 3/4 neutral gapping; long axis hip distraction, inferior hip mobilization Open books 10x Seated thoracic extension with ball 10x  Quadruped foam roll outs 10x Quadruped thread the needle with foam roll 10x At the stairs 2nd step hip flexor and quadratus lumborum muscle lengthening 10x: Rocking forward and back with arm elevation Rocking forward and back with arm reach up and over    02/11/2024 Trigger Point Dry Needling  Subsequent Treatment: Instructions provided previously at initial dry needling treatment.   Patient Verbal Consent Given: Yes Education Handout Provided: Previously Provided Muscles Treated: bil lower lumbar multifidi, Rt QL, Rt lumbar paraspinals  Electrical Stimulation Performed: No Treatment Response/Outcome: Utilized skilled palpation to identify trigger points.  During dry needling able to palpate muscle twitch and muscle elongation  Soft tissue mobilization performed to further promote tissue elongation and decreased pain.       LTR x 10 each side 5 sec hold Doorway QL stretch x 20 sec switching Shoulder row and extension with blue TB x 10 each Standing pallof press with blue TB x 12 each direction Standing ER with blue TB x 10     PATIENT EDUCATION:  Education details: Access Code: [email or web address], log roll, brief education regarding dry needling (didn't provide handout) Person educated: Patient Education method: Explanation, Demonstration, and Handouts Education comprehension: verbalized understanding and returned demonstration   HOME EXERCISE PROGRAM: Access Code: [email or web address] URL:  [email or web address] Date: 02/11/2024 Prepared by: Kristeen Sar  Exercises - Supine Lower Trunk Rotation  - 2 x daily - 7 x weekly - 1 sets - 3 reps - 20 hold - Seated Hamstring Stretch  - 2-3 x daily - 7 x weekly - 1 sets - 3 reps - 20 hold - Child's Pose Stretch  - 2 x daily - 7 x weekly - 1 sets - 3-5 reps - 20 hold - Doorway Hip Flexor Stretch with Chair  - 3 x daily - 7 x weekly - 1 sets - 3 reps - 20 hold - Standing Shoulder Row with Anchored Resistance  - 1 x daily - 7 x weekly - 1-2 sets - 10 reps - Shoulder extension with resistance - Neutral  - 1 x daily - 7 x weekly - 1-2 sets - 10 reps - Standing Anti-Rotation Press with Anchored Resistance  - 1 x daily - 7 x weekly - 1-2 sets - 10 reps - Shoulder External Rotation and Scapular Retraction with Resistance  - 1 x daily - 7 x weekly - 1-2 sets - 10 reps   ASSESSMENT:   CLINICAL IMPRESSION: The patient benefits significantly from dry needling and manual therapy to stimulate underlying myofascial trigger points and muscular tissue for management of neuromusculoskeletal pain and address movement impairments.  He reports pain reduction from 8/10 to 3/10 after each dry needling session.  He reports 3 days of reduced pain and then pain returns to prior levels.  Sleep is interrupted.   Much improved soft tissue mobility and decreased tender point size and number following treatment session today and he reports 3/10 at the end of session.  Pt would like to follow-up with  MD due to continued pain in the Rt thoracic and lumbar spine along ribs. He is focally tender at Rt QL, distal ribs ribs 10-12. The patient was encouraged in regular performance of HEP post DN including soft tissue lengthening and strengthening exercises to enhance long term benefit.    OBJECTIVE IMPAIRMENTS: decreased ROM, decreased strength, hypomobility, increased fascial restrictions, increased muscle spasms, impaired flexibility, improper body mechanics,  postural dysfunction, and pain.    ACTIVITY LIMITATIONS: carrying, sitting, standing, squatting, sleeping, transfers, bed mobility, and locomotion level   PARTICIPATION LIMITATIONS: meal prep, cleaning, driving, shopping, community activity, and occupation   PERSONAL FACTORS: Profession are also affecting patient's functional outcome.    REHAB POTENTIAL: Good   CLINICAL DECISION MAKING: Stable/uncomplicated   EVALUATION COMPLEXITY: Low     GOALS: Goals reviewed with patient? Yes   SHORT TERM GOALS: Target date: 03/04/2024     Be independent in initial HEP Baseline: Goal status: INITIAL   2.  Report waking < or = to 2x/night with pain  Baseline: waking 1x and not able to get back to sleep (02/19/24)  Goal status: in progress   3.  Report < or = to 7/10 max low back pain with work and home tasks Baseline: up to 8/10 (02/19/24) Goal status: in progress    4.  Verbalize and demonstrate core activation and body mechanics modifications for lumbar protection with daily tasks  Baseline:  Goal status: INITIAL     LONG TERM GOALS: Target date: 04/01/2024     Be independent in advanced HEP Baseline:  Goal status: in progress   2.  Improve Modified Oswestry to < or = to 10% disability (5/50) Baseline: 10/50  Goal status: INITIAL   3.  Report sleeping through the night without interruption > or = to 5x/wk Baseline: waking 2-3x/night  Goal status: INITIAL   4.  Report > or = to 70% reduction in LBP with work and daily tasks  Baseline: 6-10/10 Goal status: INITIAL       PLAN:   PT FREQUENCY: 2x/week   PT DURATION: 8 weeks   PLANNED INTERVENTIONS: 97110-Therapeutic exercises, 97530- Therapeutic activity, 97112- Neuromuscular re-education, 97535- Self Care, 02859- Manual therapy, [phone]- Canalith repositioning, [id number]- Aquatic Therapy, G0283- Electrical stimulation (unattended), [phone]- Electrical stimulation (manual), 20560 (1-2 muscles), 20561 (3+ muscles)- Dry Needling,  Patient/Family education, Taping, Joint mobilization, Joint manipulation, Spinal manipulation, Spinal mobilization, Vestibular training, Cryotherapy, and Moist heat.   PLAN FOR NEXT SESSION: ; assess response to dry needling and updated HEP; continue core and scapular strength, work on log roll.  Pt plans to see MD-submit for more auth if he returns to PT   Burnard Joy, PT 02/19/24 9:39 AM    [facility] [address] [place], [place] [address] Phone # [phone] Fax [phone]
